# Patient Record
Sex: Female | Born: 1995 | Race: Black or African American | Hispanic: No | Marital: Single | Smoking: Never smoker
Health system: Southern US, Community
[De-identification: ages and names within clinical notes are randomized; demographics above are authoritative.]

## PROBLEM LIST (undated history)

## (undated) DIAGNOSIS — G43009 Migraine without aura, not intractable, without status migrainosus: Secondary | ICD-10-CM

## (undated) DIAGNOSIS — T7840XA Allergy, unspecified, initial encounter: Secondary | ICD-10-CM

## (undated) DIAGNOSIS — J45909 Unspecified asthma, uncomplicated: Secondary | ICD-10-CM

## (undated) DIAGNOSIS — G809 Cerebral palsy, unspecified: Secondary | ICD-10-CM

## (undated) DIAGNOSIS — I639 Cerebral infarction, unspecified: Secondary | ICD-10-CM

## (undated) DIAGNOSIS — K219 Gastro-esophageal reflux disease without esophagitis: Secondary | ICD-10-CM

## (undated) HISTORY — DX: Cerebral infarction, unspecified: I63.9

## (undated) HISTORY — PX: SHUNT REPLACEMENT: SHX5403

## (undated) HISTORY — DX: Unspecified asthma, uncomplicated: J45.909

## (undated) HISTORY — DX: Neonatal cerebral infarction, unspecified side: P91.829

## (undated) HISTORY — PX: TENDON RELEASE: SHX230

## (undated) HISTORY — DX: Migraine without aura, not intractable, without status migrainosus: G43.009

## (undated) HISTORY — DX: Gastro-esophageal reflux disease without esophagitis: K21.9

## (undated) HISTORY — DX: Allergy, unspecified, initial encounter: T78.40XA

---
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
## 2017-05-21 ENCOUNTER — Other Ambulatory Visit: Payer: Self-pay | Admitting: Allergy and Immunology

## 2017-07-24 ENCOUNTER — Other Ambulatory Visit: Payer: Self-pay | Admitting: Allergy and Immunology

## 2017-07-24 NOTE — Telephone Encounter (Signed)
Received fax for refill for Omeprazole 40 mg. Patient was last seen 09/05/2016. Patient was to return in June. Patient needs office visit.

## 2017-09-14 ENCOUNTER — Telehealth: Payer: Self-pay

## 2017-09-14 NOTE — Telephone Encounter (Signed)
Received fax requesting refills for Symbicort and Omeprazole. Pt is due for OV.

## 2017-09-25 DIAGNOSIS — G930 Cerebral cysts: Secondary | ICD-10-CM | POA: Insufficient documentation

## 2017-09-25 DIAGNOSIS — N920 Excessive and frequent menstruation with regular cycle: Secondary | ICD-10-CM | POA: Insufficient documentation

## 2017-09-25 DIAGNOSIS — R569 Unspecified convulsions: Secondary | ICD-10-CM | POA: Insufficient documentation

## 2017-11-01 DIAGNOSIS — D500 Iron deficiency anemia secondary to blood loss (chronic): Secondary | ICD-10-CM | POA: Insufficient documentation

## 2017-11-01 DIAGNOSIS — R7303 Prediabetes: Secondary | ICD-10-CM | POA: Insufficient documentation

## 2017-11-20 ENCOUNTER — Encounter: Payer: Self-pay | Admitting: Allergy and Immunology

## 2017-11-20 ENCOUNTER — Ambulatory Visit (INDEPENDENT_AMBULATORY_CARE_PROVIDER_SITE_OTHER): Payer: Medicaid Other | Admitting: Allergy and Immunology

## 2017-11-20 VITALS — BP 100/64 | HR 76 | Resp 20

## 2017-11-20 DIAGNOSIS — J4540 Moderate persistent asthma, uncomplicated: Secondary | ICD-10-CM

## 2017-11-20 DIAGNOSIS — J3089 Other allergic rhinitis: Secondary | ICD-10-CM | POA: Diagnosis not present

## 2017-11-20 DIAGNOSIS — K219 Gastro-esophageal reflux disease without esophagitis: Secondary | ICD-10-CM | POA: Diagnosis not present

## 2017-11-20 MED ORDER — BUDESONIDE-FORMOTEROL FUMARATE 160-4.5 MCG/ACT IN AERO: 2.0000 | INHALATION_SPRAY | Freq: Two times a day (BID) | RESPIRATORY_TRACT | 3 refills | Status: DC

## 2017-11-20 MED ORDER — RANITIDINE HCL 300 MG PO TABS: 300.0000 mg | ORAL_TABLET | Freq: Every day | ORAL | 5 refills | Status: DC

## 2017-11-20 MED ORDER — OMEPRAZOLE 40 MG PO CPDR: 40.0000 mg | DELAYED_RELEASE_CAPSULE | Freq: Two times a day (BID) | ORAL | 3 refills | Status: DC

## 2017-11-20 NOTE — Progress Notes (Signed)
Follow-up Note  Referring Provider: Joaquin Courts, MD Primary Provider: Joaquin Courts, MD Date of Office Visit: 11/20/2017  Subjective:   Mia Meyer (DOB: 15-Jul-1996) is a 22 y.o. female who returns to the [facility] on 11/20/2017 in re-evaluation of the following:  HPI: Symphonie presents to this clinic in reevaluation of asthma and allergic rhinitis and LPR.  I last saw her in his clinic 05 September 2016.  With using a collection of anti-inflammatory medications for her airway and therapy directed against reflux she did very good over the course of the past year without any significant flareups of her airway issue requiring a systemic steroid or an antibiotic.  Rarely did she use a short acting bronchodilator.  Unfortunately, about a month or 2 ago she stopped using her medications and she has developed sneezing and nasal congestion and coughing and lots of reflux with belching and lots of throat clearing.  She has not had any associated fever or ugly nasal discharge or ugly sputum production or chest pain.  She did obtain the flu vaccine this past year.  Allergies as of 11/20/2017      Reactions   Molds & Smuts Swelling   Cough.    Other    Seasonal Allergies       Medication List      albuterol (2.5 MG/3ML) 0.083% nebulizer solution Commonly known as:  PROVENTIL Inhale 3 mLs into the lungs every 4 (four) hours as needed for wheezing (cough.).   PROAIR HFA 108 (90 Base) MCG/ACT inhaler Generic drug:  albuterol Inhale two puffs every 4-6 hours if needed for cough or wheeze   budesonide-formoterol 160-4.5 MCG/ACT inhaler Commonly known as:  SYMBICORT Inhale 2 puffs into the lungs 2 (two) times daily.   cetirizine 10 MG tablet Commonly known as:  ZYRTEC Take by mouth.   cyclobenzaprine 5 MG tablet Commonly known as:  FLEXERIL Take 5 mg by mouth 2 (two) times daily. As directed.   DIASTAT ACUDIAL 10 MG Gel Generic drug:  diazepam     fluticasone 50 MCG/ACT nasal spray Commonly known as:  FLONASE Place into the nose.   GuanFACINE HCl 3 MG Tb24 Take 3 mg by mouth daily.   ibuprofen 600 MG tablet Commonly known as:  ADVIL,MOTRIN Take 600 mg by mouth every 6 (six) hours as needed for pain.   montelukast 10 MG tablet Commonly known as:  SINGULAIR Take 1 tablet (10 mg total) by mouth at bedtime.   norethindrone-ethinyl estradiol 1-20 MG-MCG tablet Commonly known as:  JUNEL FE,GILDESS FE,LOESTRIN FE Take by mouth.   omeprazole 40 MG capsule Commonly known as:  PRILOSEC Take 1 capsule (40 mg total) by mouth 2 (two) times daily.   polyethylene glycol powder powder Commonly known as:  GLYCOLAX/MIRALAX 1/2 TO 1 CAPFUL IN 8OZ BEVERAGE DAILY AS NEEDED   QVAR 40 MCG/ACT inhaler Generic drug:  beclomethasone inhale 2 puffs by mouth twice a day   SETLAKIN 0.15-0.03 MG tablet Generic drug:  levonorgestrel-ethinyl estradiol Take 1 tablet by mouth daily.   triamcinolone cream 0.1 % Commonly known as:  KENALOG Apply 1 application topically 2 (two) times daily.       Past Medical History:  Diagnosis Date  . Allergy    mold allergy  . Asthma   . Cerebral palsy ([facility])   . GERD (gastroesophageal reflux disease)   . Neonatal stroke [facility])     Past Surgical History:  Procedure Laterality Date  .  SHUNT REPLACEMENT Left    Shunt replaced in 2002  . TENDON RELEASE Right     Review of systems negative except as noted in HPI / PMHx or noted below:  Review of Systems  Constitutional: Negative.   HENT: Negative.   Eyes: Negative.   Respiratory: Negative.   Cardiovascular: Negative.   Gastrointestinal: Negative.   Genitourinary: Negative.   Musculoskeletal: Negative.   Skin: Negative.   Neurological: Negative.   Endo/Heme/Allergies: Negative.   Psychiatric/Behavioral: Negative.      Objective:   Vitals:   11/20/17 1040  BP: 100/64  Pulse: 76  Resp: 20          Physical Exam  Constitutional:  She is well-developed, well-nourished, and in no distress.  HENT:  Head: Normocephalic.  Right Ear: Tympanic membrane, external ear and ear canal normal.  Left Ear: Tympanic membrane, external ear and ear canal normal.  Nose: Nose normal. No mucosal edema or rhinorrhea.  Mouth/Throat: Uvula is midline, oropharynx is clear and moist and mucous membranes are normal. No oropharyngeal exudate.  Eyes: Conjunctivae are normal.  Neck: Trachea normal. No tracheal tenderness present. No tracheal deviation present. No thyromegaly present.  Cardiovascular: Normal rate, regular rhythm, S1 normal, S2 normal and normal heart sounds.  No murmur heard. Pulmonary/Chest: Breath sounds normal. No stridor. No respiratory distress. She has no wheezes. She has no rales.  Musculoskeletal: She exhibits no edema.  Lymphadenopathy:       Head (right side): No tonsillar adenopathy present.       Head (left side): No tonsillar adenopathy present.    She has no cervical adenopathy.  Neurological: She is alert. Gait normal.  Skin: No rash noted. She is not diaphoretic. No erythema. Nails show no clubbing.  Psychiatric: Mood and affect normal.    Diagnostics:    Spirometry was performed and demonstrated an FEV1 of 2.06 at 74 % of predicted.  The patient had an Asthma Control Test with the following results: ACT Total Score: 16.    Assessment and Plan:   1. Not well controlled moderate persistent asthma   2. Other allergic rhinitis   3. LPRD (laryngopharyngeal reflux disease)     1. Continue to Treat inflammation:   A. Symbicort 160 - 2 inhalations twice a day with spacer  B. nasal fluticasone one spray each nostril one time per day  C. montelukast 10 mg tablet 1 time per day  2. Continue to Treat reflux:   A. omeprazole 40 mg twice a day  B. ranitidine 300 mg once a day in evening  C. no caffeine or chocolate consumption  3. If needed:   A. ProAir HFA or albuterol nebulization  B. cetirizine 10 mg  tablet 1 time per day  4. Return to clinic in 6 months or earlier if there is a problem  Zamya should do much better regarding her respiratory tract symptoms when she restarts therapy directed against respiratory tract inflammation and reflux as noted above.  She and her mom will keep in contact with me noting her response as she moves forward.  I will see her back in this clinic in 6 months or earlier if there is a problem.  Allena Katz, MD Allergy / Immunology [place] [facility]

## 2017-11-20 NOTE — Patient Instructions (Signed)
  1. Continue to Treat inflammation:   A. Symbicort 160 - 2 inhalations twice a day with spacer  B. nasal fluticasone one spray each nostril one time per day  C. montelukast 10 mg tablet 1 time per day  2. Continue to Treat reflux:   A. omeprazole 40 mg twice a day  B. ranitidine 300 mg once a day in evening  C. no caffeine or chocolate consumption  3. If needed:   A. ProAir HFA or albuterol nebulization  B. cetirizine 10 mg tablet 1 time per day  4. Return to clinic in 6 months or earlier if there is a problem

## 2017-11-21 ENCOUNTER — Encounter: Payer: Self-pay | Admitting: Allergy and Immunology

## 2017-11-27 ENCOUNTER — Encounter: Payer: Self-pay | Admitting: Neurology

## 2017-11-27 ENCOUNTER — Ambulatory Visit (INDEPENDENT_AMBULATORY_CARE_PROVIDER_SITE_OTHER): Payer: Medicaid Other | Admitting: Neurology

## 2017-11-27 ENCOUNTER — Other Ambulatory Visit: Payer: Self-pay

## 2017-11-27 ENCOUNTER — Ambulatory Visit: Payer: Medicaid Other | Admitting: Neurology

## 2017-11-27 VITALS — BP 123/81 | HR 78 | Ht 63.0 in | Wt 223.5 lb

## 2017-11-27 DIAGNOSIS — G479 Sleep disorder, unspecified: Secondary | ICD-10-CM

## 2017-11-27 DIAGNOSIS — Z982 Presence of cerebrospinal fluid drainage device: Secondary | ICD-10-CM

## 2017-11-27 DIAGNOSIS — G43009 Migraine without aura, not intractable, without status migrainosus: Secondary | ICD-10-CM | POA: Diagnosis not present

## 2017-11-27 DIAGNOSIS — G808 Other cerebral palsy: Secondary | ICD-10-CM | POA: Diagnosis not present

## 2017-11-27 DIAGNOSIS — Z87898 Personal history of other specified conditions: Secondary | ICD-10-CM | POA: Diagnosis not present

## 2017-11-27 DIAGNOSIS — R269 Unspecified abnormalities of gait and mobility: Secondary | ICD-10-CM | POA: Diagnosis not present

## 2017-11-27 HISTORY — DX: Migraine without aura, not intractable, without status migrainosus: G43.009

## 2017-11-27 MED ORDER — SUMATRIPTAN SUCCINATE 100 MG PO TABS: 100.0000 mg | ORAL_TABLET | Freq: Two times a day (BID) | ORAL | 2 refills | Status: DC | PRN

## 2017-11-27 NOTE — Patient Instructions (Signed)
   We will use Imitrex if needed for the migraine headache.   Use Magnesium suplimentation for the muscle spasms.  We will get a sleep evauation.

## 2017-11-27 NOTE — Progress Notes (Signed)
Reason for visit: History of seizures, migraine headache  Referring physician: Dr. Teryl Meyer is a 22 y.o. female  History of present illness:  Mia Meyer is a 23 year old right-handed black female with a history of cerebral palsy with a right hemiparesis.  The patient has required a VP shunt placement, she is followed through [facility].  The patient has a history of seizures since she was around 53 month old, she has not had any seizures since 2005, she is currently off of seizure medications.  She does have Diastat to take if needed.  The mother indicates that when the patient has a seizure she will turn her head to the left and she will go limp and may fall over.  She does not convulse.  The patient last had MRI of the brain on 08 May 2017 and her shunt was stable at that time.  The patient has developed some problems with migraine headaches that may occur on average once or twice a month.  The migraine headaches last for several hours, but the patient will sleep up to the day of the headache.  The mother goes on to say that the patient sleeps a lot anyway, she oftentimes will sleep through classes in school.  The patient does snore some at night.  The patient will take Tylenol or ibuprofen for the headache without much benefit, she occasionally have some nausea and vomiting.  Chocolate may activate the headache.  She does have some chronic gait instability associated with her right hemiparesis.  She denies any numbness of the extremities, she will fall on occasion.  She has chronic constipation issues and she does have some urinary urgency and occasional incontinence.  The patient is sent through this office for further evaluation.  Past Medical History:  Diagnosis Date  . Allergy    mold allergy  . Asthma   . Cerebral palsy ([place])   . Common migraine 11/27/2017  . GERD (gastroesophageal reflux disease)   . Neonatal stroke [facility])     Past Surgical History:    Procedure Laterality Date  . SHUNT REPLACEMENT Left    Shunt replaced in 2002  . TENDON RELEASE Right     Family History  Problem Relation Age of Onset  . Migraines Mother   . Anxiety disorder Mother   . Bipolar disorder Neg Hx   . Schizophrenia Neg Hx   . Depression Neg Hx   . ADD / ADHD Neg Hx   . Autism Neg Hx   . Seizures Neg Hx     Social history:  reports that she has never smoked. She has never used smokeless tobacco. She reports that she does not drink alcohol or use drugs.  Medications:  Prior to Admission medications   Medication Sig Start Date End Date Taking? Authorizing Provider  albuterol (PROVENTIL) (2.5 MG/3ML) 0.083% nebulizer solution Inhale 3 mLs into the lungs every 4 (four) hours as needed for wheezing (cough.).  05/24/16  Yes [provider]  budesonide-formoterol (SYMBICORT) 160-4.5 MCG/ACT inhaler Inhale 2 puffs into the lungs 2 (two) times daily. 11/20/17  Yes Kozlow, Donnamarie Poag, MD  cetirizine (ZYRTEC) 10 MG tablet Take by mouth.   Yes [provider]  cyclobenzaprine (FLEXERIL) 5 MG tablet Take 5 mg by mouth 2 (two) times daily. As directed. 05/13/16  Yes [provider]  DIASTAT ACUDIAL 10 MG GEL  09/07/16  Yes [provider]  fluticasone (FLONASE) 50 MCG/ACT nasal spray Place into the  nose. 08/08/13  Yes [provider]  GuanFACINE HCl 3 MG TB24 Take 3 mg by mouth daily. 05/24/16  Yes [provider]  ibuprofen (ADVIL,MOTRIN) 600 MG tablet Take 600 mg by mouth every 6 (six) hours as needed for pain. 05/13/16  Yes [provider]  montelukast (SINGULAIR) 10 MG tablet Take 1 tablet (10 mg total) by mouth at bedtime. 07/04/16  Yes Kozlow, Donnamarie Poag, MD  norethindrone-ethinyl estradiol (JUNEL FE,GILDESS FE,LOESTRIN FE) 1-20 MG-MCG tablet Take by mouth.   Yes [provider]  omeprazole (PRILOSEC) 40 MG capsule Take 1 capsule (40 mg total) by mouth 2 (two) times daily. 11/20/17  Yes Kozlow, Donnamarie Poag,  MD  polyethylene glycol powder (GLYCOLAX/MIRALAX) powder 1/2 TO 1 CAPFUL IN 8OZ BEVERAGE DAILY AS NEEDED 09/06/16  Yes [provider]  PROAIR HFA 108 (90 Base) MCG/ACT inhaler Inhale two puffs every 4-6 hours if needed for cough or wheeze 09/06/16  Yes Kozlow, Donnamarie Poag, MD  QVAR 40 MCG/ACT inhaler inhale 2 puffs by mouth twice a day 08/11/16  Yes Kozlow, Donnamarie Poag, MD  ranitidine (ZANTAC) 300 MG tablet Take 1 tablet (300 mg total) by mouth at bedtime. 11/20/17  Yes Kozlow, Donnamarie Poag, MD  SETLAKIN 0.15-0.03 MG tablet Take 1 tablet by mouth daily. 05/23/16  Yes [provider]  triamcinolone cream (KENALOG) 0.1 % Apply 1 application topically 2 (two) times daily. 05/24/16  Yes [provider]  SUMAtriptan (IMITREX) 100 MG tablet Take 1 tablet (100 mg total) by mouth 2 (two) times daily as needed for up to 1 dose for migraine. 11/27/17   Kathrynn Ducking, MD      Allergies  Allergen Reactions  . Molds & Smuts Swelling    Cough.   . Other     Seasonal Allergies      ROS:  Out of a complete 14 system review of symptoms, the patient complains only of the following symptoms, and all other reviewed systems are negative.  Headache Drowsiness  Blood pressure 123/81, pulse 78, height 5\' 3"  (1.6 m), weight 223 lb 8 oz (101.4 kg).  Physical Exam  General: The patient is alert and cooperative at the time of the examination.  The patient is markedly obese.  Eyes: Pupils are equal, round, and reactive to light. Discs are flat bilaterally.  Neck: The neck is supple, no carotid bruits are noted.  Respiratory: The respiratory examination is clear.  Cardiovascular: The cardiovascular examination reveals a regular rate and rhythm, no obvious murmurs or rubs are noted.  Skin: Extremities are without significant edema.  Relative atrophy of the right arm and right leg is noted as compared to the left side.  Neurologic Exam  Mental status: The patient is alert and oriented x 3 at  the time of the examination. The patient has apparent normal recent and remote memory, with an apparently normal attention span and concentration ability.  Cranial nerves: Facial symmetry is present. There is good sensation of the face to pinprick and soft touch bilaterally. The strength of the facial muscles and the muscles to head turning and shoulder shrug are normal bilaterally. Speech is well enunciated, no aphasia or dysarthria is noted. Extraocular movements are full. Visual fields are full. The tongue is midline, and the patient has symmetric elevation of the soft palate. No obvious hearing deficits are noted.  Motor: The motor testing reveals 5 over 5 strength of the left extremities.  On the right side, the patient has 4/5 strength with  grip and 4+/5 strength with biceps and triceps and deltoid strength, and with proximal strength of the right leg.  The patient has a right foot drop good symmetric motor tone is noted throughout.  Sensory: Sensory testing is intact to pinprick, soft touch, vibration sensation, and position sense on all 4 extremities. No evidence of extinction is noted.  Coordination: Cerebellar testing reveals good finger-nose-finger and heel-to-shin bilaterally with exception the patient has some difficulty performing finger-nose-finger on the right.  Gait and station: Gait is associated with a circumduction type gait with the right leg.  Tandem gait was not attempted.  Romberg is negative. No drift is seen.  Reflexes: Deep tendon reflexes are symmetric, but are slightly depressed bilaterally. Toes are downgoing bilaterally.   Assessment/Plan:  1.  Cerebral palsy, right hemiparesis  2.  History of seizures  3.  Migraine headache  4.  Excessive daytime drowsiness  The patient will be placed on Imitrex to take if needed for the migraine.  Given the excessive daytime drowsiness, the patient will be referred to have a sleep evaluation.  The patient has not had any  seizures in almost 15 years.  There is no indication for antiepileptic medications at this time.  The patient will follow-up in 6 months.  Jill Alexanders MD 11/27/2017 9:58 AM  [facility] [address] [place], [place] [address]  Phone [phone] Fax [phone]

## 2018-01-17 ENCOUNTER — Institutional Professional Consult (permissible substitution): Payer: Medicaid Other | Admitting: Neurology

## 2018-02-21 ENCOUNTER — Institutional Professional Consult (permissible substitution): Payer: Medicaid Other | Admitting: Neurology

## 2018-04-08 ENCOUNTER — Telehealth: Payer: Self-pay | Admitting: Neurology

## 2018-04-08 MED ORDER — SUMATRIPTAN SUCCINATE 100 MG PO TABS: ORAL_TABLET | ORAL | 2 refills | Status: DC

## 2018-04-08 NOTE — Telephone Encounter (Addendum)
Called and spoke with patient. Mother was on the phone with pt. I did advise new DPR form will need to be completed for [place] to speak with mother in the future. They both verbalized understanding. I went over last OV note and verified they were referring to rx imitrex to take prn for migraines. I did remind her she gets #10/month and to only take prn. Verified pharmacy on file and refilled for pt. Also reminded her about Dr. Rexene Alberts appt on 04/10/18. She verbalized understanding.

## 2018-04-08 NOTE — Telephone Encounter (Signed)
Patient's mothers called and stated that Dr. Jannifer Franklin had wanted her daughter to start a new medication for Migraines but the pharmacy has not received it. She did not know the name of the medication. The other was asking questions but I told her I was unable to discuss with her because she is not listed on the patients DPR. I told her I would send her message back to Dr. Jannifer Franklin' nurse and advised for the patient to call back with additional questions.

## 2018-04-08 NOTE — Telephone Encounter (Addendum)
At last OV, Dr. Jannifer Franklin provided patient with imitrex to take prn for migraines. I called her pharmacy and she received last refill on 03/14/18 for this.  Appears pt has Sleep consult with Dr. Rexene Alberts on 04/10/18.

## 2018-04-10 ENCOUNTER — Telehealth: Payer: Self-pay | Admitting: *Deleted

## 2018-04-10 ENCOUNTER — Encounter: Payer: Self-pay | Admitting: Neurology

## 2018-04-10 ENCOUNTER — Ambulatory Visit: Payer: Medicaid Other | Admitting: Neurology

## 2018-04-10 VITALS — BP 102/80 | HR 80 | Ht 63.0 in | Wt 225.0 lb

## 2018-04-10 DIAGNOSIS — R0683 Snoring: Secondary | ICD-10-CM | POA: Diagnosis not present

## 2018-04-10 DIAGNOSIS — G4719 Other hypersomnia: Secondary | ICD-10-CM | POA: Diagnosis not present

## 2018-04-10 DIAGNOSIS — R351 Nocturia: Secondary | ICD-10-CM

## 2018-04-10 DIAGNOSIS — E669 Obesity, unspecified: Secondary | ICD-10-CM | POA: Diagnosis not present

## 2018-04-10 DIAGNOSIS — Z72821 Inadequate sleep hygiene: Secondary | ICD-10-CM | POA: Diagnosis not present

## 2018-04-10 NOTE — Patient Instructions (Signed)
Thank you for choosing [facility] for your sleep related care! It was nice to meet you today! I appreciate that you entrust me with your sleep related healthcare concerns. I hope, I was able to address at least some of your concerns today, and that I can help you feel reassured and also get better.    Here is what we discussed today and what we came up with as our plan for you:   Please remember to try to maintain good sleep hygiene, which means: Keep a regular sleep and wake schedule, try not to exercise or have a meal within 2 hours of your bedtime, try to keep your bedroom conducive for sleep, that is, cool and dark, without light distractors such as an illuminated alarm clock, and refrain from watching TV right before sleep or in the middle of the night and do not keep the TV or radio on during the night. Also, try not to use or play on electronic devices at bedtime, such as your cell phone, tablet PC or laptop. If you like to read at bedtime on an electronic device, try to dim the background light as much as possible. Do not eat in the middle of the night.   Based on your symptoms and your exam I believe you are at risk for obstructive sleep apnea (aka OSA), and I think we should proceed with a sleep study to determine whether you do or do not have OSA and how severe it is. Even, if you have mild OSA, I may want you to consider treatment with CPAP, as treatment of even borderline or mild sleep apnea can result and improvement of symptoms such as sleep disruption, daytime sleepiness, nighttime bathroom breaks, restless leg symptoms, improvement of headache syndromes, even improved mood disorder.   Please remember, the long-term risks and ramifications of untreated moderate to severe obstructive sleep apnea are: increased Cardiovascular disease, including congestive heart failure, stroke, difficult to control hypertension, treatment resistant obesity, arrhythmias, especially irregular  heartbeat commonly known as A. Fib. (atrial fibrillation); even type 2 diabetes has been linked to untreated OSA.   Sleep apnea can cause disruption of sleep and sleep deprivation in most cases, which, in turn, can cause recurrent headaches, problems with memory, mood, concentration, focus, and vigilance. Most people with untreated sleep apnea report excessive daytime sleepiness, which can affect their ability to drive. Please do not drive if you feel sleepy. Patients with sleep apnea developed difficulty initiating and maintaining sleep (aka insomnia).   Having sleep apnea may increase your risk for other sleep disorders, including involuntary behaviors sleep such as sleep terrors, sleep talking, sleepwalking.    Having sleep apnea can also increase your risk for restless leg syndrome and leg movements at night.   Please note that untreated obstructive sleep apnea may carry additional perioperative morbidity. Patients with significant obstructive sleep apnea (typically, in the moderate to severe degree) should receive, if possible, perioperative PAP (positive airway pressure) therapy and the surgeons and particularly the anesthesiologists should be informed of the diagnosis and the severity of the sleep disordered breathing.   I will likely see you back after your sleep study to go over the test results and where to go from there. We will call you after your sleep study to advise about the results (most likely, you will hear from [place], my nurse) and to set up an appointment at the time, as necessary.    Our sleep lab administrative assistant will call you to  schedule your sleep study and give you further instructions, regarding the check in process for the sleep study, arrival time, what to bring, when you can expect to leave after the study, etc., and to answer any other logistical questions you may have. If you don't hear back from her by about 2 weeks from now, please feel free to call her direct  line at [phone] or you can call our general clinic number, or email [place] through My Chart.

## 2018-04-10 NOTE — Progress Notes (Signed)
Subjective:    Patient ID: Mia Meyer is a 22 y.o. female.  HPI     Star Age, MD, PhD [facility] [address] [place], [place] [address]  Dear Lanny Hurst,   I saw your patient, Mia Meyer, upon your kind request in my clinic today for initial consultation of her sleep disorder, in particular her excessive daytime somnolence. The patient is accompanied by her mother and younger sister today. As you know, Mia Meyer is a 22 year old with an underlying medical history of seizures, migraine headaches, cerebral palsy with right-sided hemiparesis, VP shunt placement and obesity, who per mom has snoring and daytime somnolence. I reviewed an office note from 11/27/2017. She does not keep a very scheduled nighttime or rise time routine. She is often in bed around 8 and tends to play on her cell phone, she may not be asleep until after midnight. Her rise time is around 8:30 AM. She has a cousin with sleep apnea. She had sleep study testing in November 2016 at [facility] which was negative for sleep apnea at the time. She has gained about 25 pounds since then. She has nocturia about 2-3 times per average night, sometimes as many as 5 times per night. Her Epworth sleepiness score is 19 out of 24, fatigue score is 36 out of 63.  Her Past Medical History Is Significant For: Past Medical History:  Diagnosis Date  . Allergy    mold allergy  . Asthma   . Cerebral palsy ([place])   . Common migraine 11/27/2017  . GERD (gastroesophageal reflux disease)   . Neonatal stroke [facility])     Her Past Surgical History Is Significant For: Past Surgical History:  Procedure Laterality Date  . SHUNT REPLACEMENT Left    Shunt replaced in 2002  . TENDON RELEASE Right     Her Family History Is Significant For: Family History  Problem Relation Age of Onset  . Migraines Mother   . Anxiety disorder Mother   . Bipolar disorder Neg Hx   . Schizophrenia Neg Hx    . Depression Neg Hx   . ADD / ADHD Neg Hx   . Autism Neg Hx   . Seizures Neg Hx     Her Social History Is Significant For: Social History   Socioeconomic History  . Marital status: Single    Spouse name: Not on file  . Number of children: 0  . Years of education: 69  . Highest education level: Not on file  Occupational History  . Not on file  Social Needs  . Financial resource strain: Not on file  . Food insecurity:    Worry: Not on file    Inability: Not on file  . Transportation needs:    Medical: Not on file    Non-medical: Not on file  Tobacco Use  . Smoking status: Never Smoker  . Smokeless tobacco: Never Used  Substance and Sexual Activity  . Alcohol use: No  . Drug use: No  . Sexual activity: Never    Birth control/protection: Pill  Lifestyle  . Physical activity:    Days per week: Not on file    Minutes per session: Not on file  . Stress: Not on file  Relationships  . Social connections:    Talks on phone: Not on file    Gets together: Not on file    Attends religious service: Not on file    Active member of club or  organization: Not on file    Attends meetings of clubs or organizations: Not on file    Relationship status: Not on file  Other Topics Concern  . Not on file  Social History Narrative   Lives w/ mother and sister   Caffeine use: 1-2 times per day    Left handed   Mia Meyer is in the 12 th grade at [employer]; doing well.     Her Allergies Are:  Allergies  Allergen Reactions  . Molds & Smuts Swelling    Cough.   . Other     Seasonal Allergies    :   Her Current Medications Are:  Outpatient Encounter Medications as of 04/10/2018  Medication Sig  . albuterol (PROVENTIL) (2.5 MG/3ML) 0.083% nebulizer solution Inhale 3 mLs into the lungs every 4 (four) hours as needed for wheezing (cough.).   Marland Kitchen budesonide-formoterol (SYMBICORT) 160-4.5 MCG/ACT inhaler Inhale 2 puffs into the lungs 2 (two) times daily.  . cetirizine (ZYRTEC) 10 MG  tablet Take by mouth.  . cyclobenzaprine (FLEXERIL) 5 MG tablet Take 5 mg by mouth 2 (two) times daily. As directed.  Marland Kitchen DIASTAT ACUDIAL 10 MG GEL   . fluticasone (FLONASE) 50 MCG/ACT nasal spray Place into the nose.  . GuanFACINE HCl 3 MG TB24 Take 3 mg by mouth daily.  Marland Kitchen ibuprofen (ADVIL,MOTRIN) 600 MG tablet Take 600 mg by mouth every 6 (six) hours as needed for pain.  . montelukast (SINGULAIR) 10 MG tablet Take 1 tablet (10 mg total) by mouth at bedtime.  . norethindrone-ethinyl estradiol (JUNEL FE,GILDESS FE,LOESTRIN FE) 1-20 MG-MCG tablet Take by mouth.  Marland Kitchen omeprazole (PRILOSEC) 40 MG capsule Take 1 capsule (40 mg total) by mouth 2 (two) times daily.  . polyethylene glycol powder (GLYCOLAX/MIRALAX) powder 1/2 TO 1 CAPFUL IN 8OZ BEVERAGE DAILY AS NEEDED  . PROAIR HFA 108 (90 Base) MCG/ACT inhaler Inhale two puffs every 4-6 hours if needed for cough or wheeze  . QVAR 40 MCG/ACT inhaler inhale 2 puffs by mouth twice a day  . ranitidine (ZANTAC) 300 MG tablet Take 1 tablet (300 mg total) by mouth at bedtime.  Jarvis Newcomer 0.15-0.03 MG tablet Take 1 tablet by mouth daily.  . SUMAtriptan (IMITREX) 100 MG tablet Take 1 tablet at onset of migraine. Can repeat once in 2 hr if needed. No more than 2 tabs in 24 hr.  . triamcinolone cream (KENALOG) 0.1 % Apply 1 application topically 2 (two) times daily.   No facility-administered encounter medications on file as of 04/10/2018.   :  Review of Systems:  Out of a complete 14 point review of systems, all are reviewed and negative with the exception of these symptoms as listed below:  Review of Systems  Neurological:       Pt presents today to discuss her sleep. Pt had a sleep study about 9 years ago and believes that it was normal. Pt does endorse snoring.  Epworth Sleepiness Scale 0= would never doze 1= slight chance of dozing 2= moderate chance of dozing 3= high chance of dozing  Sitting and reading: 2 Watching TV: 3 Sitting inactive in a  public place (ex. Theater or meeting): 2 As a passenger in a car for an hour without a break: 3 Lying down to rest in the afternoon: 3 Sitting and talking to someone: 2 Sitting quietly after lunch (no alcohol): 2 In a car, while stopped in traffic: 2 Total: 19     Objective:  Neurological Exam  Physical  Exam Physical Examination:   Vitals:   04/10/18 1603  BP: 102/80  Pulse: 80   General Examination: The patient is a very pleasant 22 y.o. female in no acute distress. She appears well-developed and well-nourished and well groomed.   HEENT: Normocephalic, atraumatic, pupils are equal, round and reactive to light and accommodation. Extraocular tracking is good without limitation to gaze excursion or nystagmus noted. Normal smooth pursuit is noted. Hearing is grossly intact. Face is symmetric with normal facial animation and normal facial sensation. Speech is clear with no dysarthria noted. There is no hypophonia. There is no lip, neck/head, jaw or voice tremor. Neck is supple with full range of passive and active motion. There are no carotid bruits on auscultation. Oropharynx exam reveals: mild mouth dryness, adequate dental hygiene and moderate airway crowding, due to Tonsillar size of 2-3+, larger tongue, large uvula. Mallampati is class II. Neck circumference is 16-1/4 inches. She has a mild overbite. Tongue protrudes centrally and palate elevates symmetrically.  Chest: Clear to auscultation without wheezing, rhonchi or crackles noted.  Heart: S1+S2+0, regular and normal without murmurs, rubs or gallops noted.   Abdomen: Soft, non-tender and non-distended with normal bowel sounds appreciated on auscultation.  Extremities: There is no pitting edema in the distal lower extremities bilaterally.   Skin: Warm and dry without trophic changes noted.  Musculoskeletal: exam reveals no obvious joint deformities, tenderness or joint swelling or erythema.   Neurologically:  Mental status:  The patient is awake, alert and oriented in all 4 spheres. Her immediate and remote memory, attention, language skills and fund of knowledge are appropriate. There is no evidence of aphasia, agnosia, apraxia or anomia. Speech is clear with normal prosody and enunciation. Thought process is linear. Mood is normal and affect is normal.  Cranial nerves II - XII are as described above under HEENT exam. In addition: shoulder shrug is normal with equal shoulder height noted. Motor exam: Normal bulk, strength and tone is noted on the L, she has mild weakness and spasticity in the RUE > RLE. There is no tremor. Fine motor skills and coordination: intact on the L.  Cerebellar testing: No obvious dysmetria or intention tremor.  Sensory exam: intact to light touch.  Gait, station and balance: She stands easily. No veering to one side is noted. No leaning to one side is noted. Posture is age-appropriate and stance is narrow based. Gait shows mild limp on the R.          Assessment and Plan:   In summary, Mia Meyer is a very pleasant 22 y.o.-year old female with an underlying medical history of seizures, migraine headaches, cerebral palsy with right-sided hemiparesis, VP shunt placement and obesity, whose history and physical exam are concerning for obstructive sleep apnea (OSA). I had a long chat with the patient and her mother about my findings and the diagnosis of OSA, its prognosis and treatment options. We talked about medical treatments, surgical interventions and non-pharmacological approaches. I explained in particular the risks and ramifications of untreated moderate to severe OSA, especially with respect to developing cardiovascular disease down the Road, including congestive heart failure, difficult to treat hypertension, cardiac arrhythmias, or stroke. Even type 2 diabetes has, in part, been linked to untreated OSA. Symptoms of untreated OSA include daytime sleepiness, memory problems, mood  irritability and mood disorder such as depression and anxiety, lack of energy, as well as recurrent headaches, especially morning headaches. We talked about trying to maintain a healthy lifestyle in general, as  well as the importance of weight control. I encouraged the patient to eat healthy, exercise daily and keep well hydrated, to keep a scheduled bedtime and wake time routine, to not skip any meals and eat healthy snacks in between meals. I advised the patient not to drive when feeling sleepy. She does not drive. We also talked about the importance of good sleep hygiene. I recommended the following at this time: sleep study with potential positive airway pressure titration. (We will score hypopneas at 4%).   I explained the sleep test procedure to the patient and also outlined possible surgical and non-surgical treatment options of OSA, including the use of a custom-made dental device (which would require a referral to a specialist dentist or oral surgeon), upper airway surgical options, such as pillar implants, radiofrequency surgery, tongue base surgery, and UPPP (which would involve a referral to an ENT surgeon). Rarely, jaw surgery such as mandibular advancement may be considered.  I also explained the CPAP treatment option to the patient, who indicated that she would be willing to try CPAP if the need arises. I explained the importance of being compliant with PAP treatment, not only for insurance purposes but primarily to improve Her symptoms, and for the patient's long term health benefit, including to reduce Her cardiovascular risks. I answered all their questions today and the patient and her mom were in agreement. I plan to see her back after the sleep study is completed and encouraged them to call with any interim questions, concerns, problems or updates.   Thank you very much for allowing me to participate in the care of this nice patient. If I can be of any further assistance to you please do  not hesitate to talk to me.  Sincerely,   Star Age, MD, PhD

## 2018-04-10 NOTE — Telephone Encounter (Signed)
Patients mom wants Diastat Suppositories called to [facility].  States the MD that referred patient to Dr. Jannifer Franklin wants this to be filled by him since he is local.  Please call if questions.

## 2018-04-11 NOTE — Addendum Note (Signed)
Addended by: Kathrynn Ducking on: 04/11/2018 09:58 AM   Modules accepted: Orders

## 2018-04-11 NOTE — Telephone Encounter (Signed)
I called mother, left a message.  This patient has not had a seizure in over 15 years, she is not on any seizure medications, I do not think that there is an indication for the Diastat which is a very expensive medication.  If the patient has had a recent seizure, she is to contact our office.

## 2018-04-11 NOTE — Telephone Encounter (Signed)
I called the mother, the patient has not had a seizure in 15 years, there is no indication for Diastat, the patient is not even on an anticonvulsant medication at all.  If she does have recurrent seizure, they are to contact our office and we will get her worked in and we will start a seizure medication.  There is no indication for a prescription for Diastat at this time.

## 2018-04-11 NOTE — Telephone Encounter (Signed)
Pts mother Philis Nettle called stating that this medication was a backup incase the pt did have a seizure while away at school. Philis Nettle stating she is not comfortable not having a backup plan for the pt. Please call to advise.

## 2018-04-24 ENCOUNTER — Ambulatory Visit (HOSPITAL_COMMUNITY)
Admission: EM | Admit: 2018-04-24 | Discharge: 2018-04-24 | Disposition: A | Discharge status: Home / Self Care | Payer: Medicaid Other | Attending: Family Medicine | Admitting: Family Medicine

## 2018-04-24 ENCOUNTER — Encounter (HOSPITAL_COMMUNITY): Payer: Self-pay

## 2018-04-24 DIAGNOSIS — J209 Acute bronchitis, unspecified: Secondary | ICD-10-CM

## 2018-04-24 MED ORDER — FLUTICASONE PROPIONATE 50 MCG/ACT NA SUSP: 1.0000 | Freq: Every day | NASAL | 0 refills | Status: DC

## 2018-04-24 MED ORDER — AZITHROMYCIN 250 MG PO TABS: 250.0000 mg | ORAL_TABLET | Freq: Every day | ORAL | 0 refills | Status: DC

## 2018-04-24 MED ORDER — PSEUDOEPH-BROMPHEN-DM 30-2-10 MG/5ML PO SYRP: 5.0000 mL | ORAL_SOLUTION | Freq: Four times a day (QID) | ORAL | 0 refills | Status: DC | PRN

## 2018-04-24 MED ORDER — PROAIR HFA 108 (90 BASE) MCG/ACT IN AERS: INHALATION_SPRAY | RESPIRATORY_TRACT | 1 refills | Status: DC

## 2018-04-24 NOTE — Discharge Instructions (Signed)
Please begin azithromycin, 2 tablets today, 1 tablet for the following 4 days Continue to use Zyrtec, Flonase Please use cough syrup as needed every 8 hours Albuterol as needed  Cough may linger around, but as long as it is slowly improving in frequency and intensity  Please follow-up if developing fevers again, difficulty breathing, shortness of breath, wheezing, symptoms persisting without improvement

## 2018-04-24 NOTE — ED Triage Notes (Signed)
Pt presents with ongoing persistent cough.

## 2018-04-24 NOTE — ED Provider Notes (Signed)
[place]    CSN: [id number] Arrival date & time: 04/24/18  1521     History   Chief Complaint Chief Complaint  Patient presents with  . Cough    HPI Mia Meyer is a 22 y.o. female history of allergies, asthma, cerebral palsy, Patient is presenting with URI symptoms- congestion, cough, sore throat. Patient's main complaints are cough and persistence of symptoms. Symptoms have been going on for 2 weeks. Patient has tried Singulair, Zyrtec, albuterol, Flonase, Tylenol, herbal tea, with minimal relief. Denies fever, nausea, vomiting, diarrhea. Denies shortness of breath and chest pain.  Patient's mother and sister also here with similar symptoms that have been persistent.   HPI  Past Medical History:  Diagnosis Date  . Allergy    mold allergy  . Asthma   . Cerebral palsy ([place])   . Common migraine 11/27/2017  . GERD (gastroesophageal reflux disease)   . Neonatal stroke [facility])     Patient Active Problem List   Diagnosis Date Noted  . Common migraine 11/27/2017  . History of seizure 11/27/2017  . Anxiety state 09/26/2016  . VP (ventriculoperitoneal) shunt status 09/26/2016  . History of hydrocephalus as a child 09/26/2016  . 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 Difficulty with activities of daily living 09/26/2016  . Spasticity 09/26/2016  . Abnormality of gait 09/26/2016  . Cerebral palsy, hemiplegic ([place]) 08/12/2014  . Asthma, chronic 08/12/2014  . GERD (gastroesophageal reflux disease) 08/12/2014    Past Surgical History:  Procedure Laterality Date  . SHUNT REPLACEMENT Left    Shunt replaced in 2002  . TENDON RELEASE Right     OB History   None      Home Medications    Prior to Admission medications   Medication Sig Start Date End Date Taking? Authorizing Provider  albuterol (PROVENTIL) (2.5 MG/3ML) 0.083% nebulizer solution Inhale 3 mLs into the lungs every 4 (four) hours as needed for wheezing (cough.).   05/24/16   [provider]  azithromycin (ZITHROMAX) 250 MG tablet Take 1 tablet (250 mg total) by mouth daily. Take first 2 tablets together, then 1 every day until finished. 04/24/18   Larinda Herter C, PA-C  brompheniramine-pseudoephedrine-DM 30-2-10 MG/5ML syrup Take 5 mLs by mouth 4 (four) times daily as needed. 04/24/18   Demontray Franta C, PA-C  budesonide-formoterol (SYMBICORT) 160-4.5 MCG/ACT inhaler Inhale 2 puffs into the lungs 2 (two) times daily. 11/20/17   Kozlow, Donnamarie Poag, MD  cetirizine (ZYRTEC) 10 MG tablet Take by mouth.    [provider]  cyclobenzaprine (FLEXERIL) 5 MG tablet Take 5 mg by mouth 2 (two) times daily. As directed. 05/13/16   [provider]  fluticasone (FLONASE) 50 MCG/ACT nasal spray Place 1-2 sprays into both nostrils daily. 04/24/18   Maurene Hollin C, PA-C  GuanFACINE HCl 3 MG TB24 Take 3 mg by mouth daily. 05/24/16   [provider]  ibuprofen (ADVIL,MOTRIN) 600 MG tablet Take 600 mg by mouth every 6 (six) hours as needed for pain. 05/13/16   [provider]  montelukast (SINGULAIR) 10 MG tablet Take 1 tablet (10 mg total) by mouth at bedtime. 07/04/16   Kozlow, Donnamarie Poag, MD  norethindrone-ethinyl estradiol (JUNEL FE,GILDESS FE,LOESTRIN FE) 1-20 MG-MCG tablet Take by mouth.    [provider]  omeprazole (PRILOSEC) 40 MG capsule Take 1 capsule (40 mg total) by mouth 2 (two) times daily. 11/20/17   Kozlow, Donnamarie Poag, MD  polyethylene glycol powder [facility])  powder 1/2 TO 1 CAPFUL IN 8OZ BEVERAGE DAILY AS NEEDED 09/06/16   [provider]  PROAIR HFA 108 (90 Base) MCG/ACT inhaler Inhale two puffs every 4-6 hours if needed for cough or wheeze 04/24/18   Hadleigh Felber, [place] C, PA-C  QVAR 40 MCG/ACT inhaler inhale 2 puffs by mouth twice a day 08/11/16   Kozlow, Donnamarie Poag, MD  ranitidine (ZANTAC) 300 MG tablet Take 1 tablet (300 mg total) by mouth at bedtime. 11/20/17   Kozlow, Donnamarie Poag, MD  SETLAKIN 0.15-0.03 MG tablet  Take 1 tablet by mouth daily. 05/23/16   [provider]  SUMAtriptan (IMITREX) 100 MG tablet Take 1 tablet at onset of migraine. Can repeat once in 2 hr if needed. No more than 2 tabs in 24 hr. 04/08/18   Kathrynn Ducking, MD  triamcinolone cream (KENALOG) 0.1 % Apply 1 application topically 2 (two) times daily. 05/24/16   [provider]    Family History Family History  Problem Relation Age of Onset  . Migraines Mother   . Anxiety disorder Mother   . Bipolar disorder Neg Hx   . Schizophrenia Neg Hx   . Depression Neg Hx   . ADD / ADHD Neg Hx   . Autism Neg Hx   . Seizures Neg Hx     Social History Social History   Tobacco Use  . Smoking status: Never Smoker  . Smokeless tobacco: Never Used  Substance Use Topics  . Alcohol use: No  . Drug use: No     Allergies   Molds & smuts and Other   Review of Systems Review of Systems  Constitutional: Negative for activity change, appetite change, chills, fatigue and fever.  HENT: Positive for congestion and rhinorrhea. Negative for ear pain, sinus pressure, sore throat and trouble swallowing.   Eyes: Negative for discharge and redness.  Respiratory: Positive for cough and wheezing. Negative for chest tightness and shortness of breath.   Cardiovascular: Negative for chest pain.  Gastrointestinal: Negative for abdominal pain, diarrhea, nausea and vomiting.  Musculoskeletal: Negative for myalgias.  Skin: Negative for rash.  Neurological: Negative for dizziness, light-headedness and headaches.     Physical Exam Triage Vital Signs ED Triage Vitals  Enc Vitals Group     BP 04/24/18 1557 104/67     Pulse Rate 04/24/18 1557 71     Resp 04/24/18 1557 20     Temp 04/24/18 1557 98.9 F (37.2 C)     Temp Source 04/24/18 1557 Temporal     SpO2 04/24/18 1557 99 %     Weight --      Height --      Head Circumference --      Peak Flow --      Pain Score 04/24/18 1559 0     Pain Loc --      Pain Edu? --       Excl. in [place]? --    No data found.  Updated Vital Signs BP 104/67 (BP Location: Left Arm)   Pulse 71   Temp 98.9 F (37.2 C) (Temporal)   Resp 20   LMP  (LMP Unknown)   SpO2 99%   Visual Acuity Right Eye Distance:   Left Eye Distance:   Bilateral Distance:    Right Eye Near:   Left Eye Near:    Bilateral Near:     Physical Exam  Constitutional: She appears well-developed and well-nourished. No distress.  HENT:  Head: Normocephalic and atraumatic.  Bilateral ears without tenderness to palpation of external auricle, tragus and mastoid, EAC's without erythema or swelling, TM's with good bony landmarks and cone of light. Non erythematous.  Oral mucosa pink and moist, no tonsillar enlargement or exudate. Posterior pharynx patent and erythematous, no uvula deviation or swelling. Normal phonation.  Eyes: Conjunctivae are normal.  Neck: Neck supple.  Cardiovascular: Normal rate and regular rhythm.  No murmur heard. Pulmonary/Chest: Effort normal and breath sounds normal. No respiratory distress.  Breathing comfortably at rest, CTABL, no wheezing, rales or other adventitious sounds auscultated  Abdominal: Soft. There is no tenderness.  Musculoskeletal: She exhibits no edema.  Neurological: She is alert.  Skin: Skin is warm and dry.  Psychiatric: She has a normal mood and affect.  Nursing note and vitals reviewed.    UC Treatments / Results  Labs (all labs ordered are listed, but only abnormal results are displayed) Labs Reviewed - No data to display  EKG None  Radiology No results found.  Procedures Procedures (including critical care time)  Medications Ordered in UC Medications - No data to display  Initial Impression / Assessment and Plan / UC Course  I have reviewed the triage vital signs and the nursing notes.  Pertinent labs & imaging results that were available during my care of the patient were reviewed by me and considered in my medical decision making  (see chart for details).     Patient with persistent URI symptoms for 2 weeks, main symptom is cough and having occasional wheezing especially at nighttime, no wheezing heard on exam.  Will treat with azithromycin given the length of symptoms, continue Zyrtec and Flonase, provided cough syrup, continue albuterol.  Will hold off on prednisone given lungs CTA BL.  Discussed cough often will linger.  Patient stable, vital signs stable, no acute distress.  Discussed strict return precautions. Patient verbalized understanding and is agreeable with plan.  Final Clinical Impressions(s) / UC Diagnoses   Final diagnoses:  Acute bronchitis, unspecified organism     Discharge Instructions     Please begin azithromycin, 2 tablets today, 1 tablet for the following 4 days Continue to use Zyrtec, Flonase Please use cough syrup as needed every 8 hours Albuterol as needed  Cough may linger around, but as long as it is slowly improving in frequency and intensity  Please follow-up if developing fevers again, difficulty breathing, shortness of breath, wheezing, symptoms persisting without improvement   ED Prescriptions    Medication Sig Dispense Auth. Provider   azithromycin (ZITHROMAX) 250 MG tablet Take 1 tablet (250 mg total) by mouth daily. Take first 2 tablets together, then 1 every day until finished. 6 tablet Jozsef Wescoat C, PA-C   fluticasone (FLONASE) 50 MCG/ACT nasal spray Place 1-2 sprays into both nostrils daily. 16 g Derrill Bagnell C, PA-C   brompheniramine-pseudoephedrine-DM 30-2-10 MG/5ML syrup Take 5 mLs by mouth 4 (four) times daily as needed. 120 mL Yariah Selvey C, PA-C   PROAIR HFA 108 (90 Base) MCG/ACT inhaler Inhale two puffs every 4-6 hours if needed for cough or wheeze 8.5 g Baylee Mccorkel C, PA-C     Controlled Substance Prescriptions [place] Controlled Substance Registry consulted? Not Applicable   Janith Lima, [place] 04/24/18 1713

## 2018-05-11 DIAGNOSIS — E611 Iron deficiency: Secondary | ICD-10-CM | POA: Insufficient documentation

## 2018-05-20 ENCOUNTER — Telehealth: Payer: Self-pay | Admitting: Neurology

## 2018-05-20 NOTE — Telephone Encounter (Signed)
We have attempted to call the patient 2 times to schedule sleep study. Patient has been unavailable at the phone numbers we have on file and has not returned our calls. At this point we will send a letter asking pt to please contact the sleep lab to schedule their sleep study. If patient calls back we will schedule them for their sleep study. ° °

## 2018-05-23 ENCOUNTER — Other Ambulatory Visit: Payer: Self-pay | Admitting: Neurology

## 2018-05-23 ENCOUNTER — Other Ambulatory Visit: Payer: Self-pay | Admitting: Allergy and Immunology

## 2018-05-23 NOTE — Telephone Encounter (Signed)
courtesy

## 2018-05-28 NOTE — Progress Notes (Signed)
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  She has a history of seizures but none since 2005 and she is currently not on seizure medication she is gained about 100 pounds in the last couple years according to the mom.  BMI is 40 she has had a sleep evaluation but has not scheduled her sleep study.  She takes Imitrex for her migraine headaches she continues to have excessive daytime drowsiness.  She snores at night.  She returns for reevaluation   11/27/17 Mia Meyer is a 22 year old right-handed black female with a history of cerebral palsy with a right hemiparesis.  The patient has required a VP shunt placement, she is followed through [facility].  The patient has a history of seizures since she was around 74 month old, she has not had any seizures since 2005, she is currently off of seizure medications.  She does have Diastat to take if needed.  The mother indicates that when the patient has a seizure she will turn her head to the left and she will go limp and may fall over.  She does not convulse.  The patient last had MRI of the brain on 08 May 2017 and her shunt was stable at that time.  The patient has developed some problems with migraine headaches that may occur on average once or twice a month.  The migraine headaches last for several hours, but the patient will sleep up to the day of the headache.  The mother goes on to say that the patient sleeps a lot anyway, she oftentimes will sleep through classes in school.  The patient does snore some at night.  The patient will take Tylenol or ibuprofen for the headache without much benefit, she occasionally have some  nausea and vomiting.  Chocolate may activate the headache.  She does have some chronic gait instability associated with her right hemiparesis.  She denies any numbness of the extremities, she will fall on occasion.  She has chronic constipation issues and she does have some urinary urgency and occasional incontinence.  The patient is sent through this office for further evaluation.  REVIEW OF SYSTEMS: Full 14 system review of systems performed and notable only for those listed, all others are neg:  Constitutional: Daytime drowsiness Cardiovascular: neg Ear/Nose/Throat: neg  Skin: neg Eyes: neg Respiratory: neg Gastroitestinal: neg  Hematology/Lymphatic: neg  Endocrine: neg Musculoskeletal:neg Allergy/Immunology: neg Neurological: Headaches  psychiatric: neg Sleep : Snoring   ALLERGIES: Allergies  Allergen Reactions  . Molds & Smuts Swelling    Cough.   . Other     Seasonal Allergies      HOME MEDICATIONS: Outpatient Medications Prior to Visit  Medication Sig Dispense Refill  . albuterol (PROVENTIL) (2.5 MG/3ML) 0.083% nebulizer solution Inhale 3 mLs into the lungs every 4 (four) hours as needed for wheezing (cough.).   0  . cetirizine (ZYRTEC) 10 MG tablet Take by mouth.    . cyclobenzaprine (FLEXERIL) 5 MG tablet Take 5 mg by mouth 2 (two) times daily. As directed.  0  . fluticasone (FLONASE) 50 MCG/ACT nasal spray Place 1-2 sprays into both nostrils daily. 16 g 0  . GuanFACINE HCl 3 MG TB24 Take 3 mg by mouth daily.  0  .  ibuprofen (ADVIL,MOTRIN) 600 MG tablet Take 600 mg by mouth every 6 (six) hours as needed for pain.  0  . montelukast (SINGULAIR) 10 MG tablet Take 1 tablet (10 mg total) by mouth at bedtime. 30 tablet 5  . norethindrone-ethinyl estradiol (JUNEL FE,GILDESS FE,LOESTRIN FE) 1-20 MG-MCG tablet Take by mouth.    Marland Kitchen omeprazole (PRILOSEC) 40 MG capsule TAKE 1 CAPSULE(40 MG) BY MOUTH TWICE DAILY 60 capsule 0  . polyethylene glycol powder (GLYCOLAX/MIRALAX) powder  1/2 TO 1 CAPFUL IN 8OZ BEVERAGE DAILY AS NEEDED  0  . PROAIR HFA 108 (90 Base) MCG/ACT inhaler Inhale two puffs every 4-6 hours if needed for cough or wheeze 8.5 g 1  . QVAR 40 MCG/ACT inhaler inhale 2 puffs by mouth twice a day 8.7 g 3  . ranitidine (ZANTAC) 300 MG tablet Take 1 tablet (300 mg total) by mouth at bedtime. 30 tablet 5  . SETLAKIN 0.15-0.03 MG tablet Take 1 tablet by mouth daily.  0  . SUMAtriptan (IMITREX) 100 MG tablet Take 1 tablet at onset of migraine. Can repeat once in 2 hr if needed. No more than 2 tabs in 24 hr. 10 tablet 2  . SYMBICORT 160-4.5 MCG/ACT inhaler INHALE 2 PUFFS INTO THE LUNGS TWICE DAILY 10.2 g 0  . triamcinolone cream (KENALOG) 0.1 % Apply 1 application topically 2 (two) times daily.  0  . azithromycin (ZITHROMAX) 250 MG tablet Take 1 tablet (250 mg total) by mouth daily. Take first 2 tablets together, then 1 every day until finished. (Patient not taking: Reported on 05/30/2018) 6 tablet 0  . brompheniramine-pseudoephedrine-DM 30-2-10 MG/5ML syrup Take 5 mLs by mouth 4 (four) times daily as needed. (Patient not taking: Reported on 05/30/2018) 120 mL 0   No facility-administered medications prior to visit.     PAST MEDICAL HISTORY: Past Medical History:  Diagnosis Date  . Allergy    mold allergy  . Asthma   . Cerebral palsy ([place])   . Common migraine 11/27/2017  . GERD (gastroesophageal reflux disease)   . Neonatal stroke [facility])     PAST SURGICAL HISTORY: Past Surgical History:  Procedure Laterality Date  . SHUNT REPLACEMENT Left    Shunt replaced in 2002  . TENDON RELEASE Right     FAMILY HISTORY: Family History  Problem Relation Age of Onset  . Migraines Mother   . Anxiety disorder Mother   . Bipolar disorder Neg Hx   . Schizophrenia Neg Hx   . Depression Neg Hx   . ADD / ADHD Neg Hx   . Autism Neg Hx   . Seizures Neg Hx     SOCIAL HISTORY: Social History   Socioeconomic History  . Marital status: Single    Spouse name: Not on  file  . Number of children: 0  . Years of education: 68  . Highest education level: Not on file  Occupational History  . Not on file  Social Needs  . Financial resource strain: Not on file  . Food insecurity:    Worry: Not on file    Inability: Not on file  . Transportation needs:    Medical: Not on file    Non-medical: Not on file  Tobacco Use  . Smoking status: Never Smoker  . Smokeless tobacco: Never Used  Substance and Sexual Activity  . Alcohol use: No  . Drug use: No  . Sexual activity: Never    Birth control/protection: Pill  Lifestyle  . Physical activity:    Days  per week: Not on file    Minutes per session: Not on file  . Stress: Not on file  Relationships  . Social connections:    Talks on phone: Not on file    Gets together: Not on file    Attends religious service: Not on file    Active member of club or organization: Not on file    Attends meetings of clubs or organizations: Not on file    Relationship status: Not on file  . Intimate partner violence:    Fear of current or ex partner: Not on file    Emotionally abused: Not on file    Physically abused: Not on file    Forced sexual activity: Not on file  Other Topics Concern  . Not on file  Social History Narrative   Lives w/ mother and sister   Caffeine use: 1-2 times per day    Left handed   Vernell Morgans is in the 12 th grade at [employer]; doing well.      PHYSICAL EXAM  Vitals:   05/30/18 1058  BP: (!) 92/50  Pulse: 85  Weight: 231 lb (104.8 kg)  Height: 5\' 3"  (1.6 m)   Body mass index is 40.92 kg/m.  Generalized: Well developed, obese female in no acute distress  Head: normocephalic and atraumatic,. Oropharynx benign  Neck: Supple,   Musculoskeletal: Atrophy of the right arm and right leg noted as compared to the left  Neurological examination   Mentation: Alert oriented to time, place, history taking. Attention span and concentration appropriate. Recent and remote memory intact.   Follows all commands speech and language fluent.   Cranial nerve II-XII: Pupils were equal round reactive to light extraocular movements were full, visual field were full on confrontational test. Facial sensation and strength were normal. hearing was intact to finger rubbing bilaterally. Uvula tongue midline. head turning and shoulder shrug were normal and symmetric.Tongue protrusion into cheek strength was normal. Motor: normal bulk and tone, full strength in the BUE, BLE, on the left on the right 4 out of 5 strength with decreased  grip strength.  Patient has a right foot drop  Sensory: normal and symmetric to light touch, pinprick, and  Vibration, in the upper and lower extremities Coordination: finger-nose-finger, with difficulty on the right Reflexes: Depressed upper and lower plantar responses were flexor bilaterally. Gait and Station: Rising up from seated position without assistance, circumcised duction type gait with the right leg.  Romberg negative DIAGNOSTIC DATA (LABS, IMAGING, TESTING) - ASSESSMENT AND PLAN  22 y.o. year old female  has a past medical history of    1.Cerebral palsy, right hemiparesis  2.  History of seizures  3.  Migraine headache  4.  Excessive daytime drowsiness  PLAN:  Continue Imitrex at current dose will refill Can increase Flexeril to 2 tabs at bedtime Schedule sleep study on the way out today Follow-up after the sleep study Mia Bible, [facility], [facility], [facility] [address], [place], [place] [address] [phone]

## 2018-05-29 ENCOUNTER — Ambulatory Visit: Payer: Medicaid Other | Admitting: Allergy

## 2018-05-30 ENCOUNTER — Ambulatory Visit: Payer: Medicaid Other | Admitting: Nurse Practitioner

## 2018-05-30 VITALS — BP 92/50 | HR 85 | Ht 63.0 in | Wt 231.0 lb

## 2018-05-30 DIAGNOSIS — R269 Unspecified abnormalities of gait and mobility: Secondary | ICD-10-CM

## 2018-05-30 DIAGNOSIS — G43009 Migraine without aura, not intractable, without status migrainosus: Secondary | ICD-10-CM | POA: Diagnosis not present

## 2018-05-30 DIAGNOSIS — R400 Somnolence: Secondary | ICD-10-CM

## 2018-05-30 DIAGNOSIS — G808 Other cerebral palsy: Secondary | ICD-10-CM | POA: Diagnosis not present

## 2018-05-30 DIAGNOSIS — Z87898 Personal history of other specified conditions: Secondary | ICD-10-CM

## 2018-05-30 MED ORDER — SUMATRIPTAN SUCCINATE 100 MG PO TABS: ORAL_TABLET | ORAL | 3 refills | Status: DC

## 2018-05-30 NOTE — Patient Instructions (Signed)
Continue Imitrex at current dose Can increase Flexeril to 2 tabs at bedtime Schedule sleep study on the way out today Follow-up after the sleep study

## 2018-05-30 NOTE — Progress Notes (Signed)
I have read the note, and I agree with the clinical assessment and plan.  Charles K Willis   

## 2018-06-14 ENCOUNTER — Ambulatory Visit (INDEPENDENT_AMBULATORY_CARE_PROVIDER_SITE_OTHER): Payer: Medicare Other | Admitting: Neurology

## 2018-06-14 DIAGNOSIS — G4719 Other hypersomnia: Secondary | ICD-10-CM

## 2018-06-14 DIAGNOSIS — R351 Nocturia: Secondary | ICD-10-CM

## 2018-06-14 DIAGNOSIS — G4720 Circadian rhythm sleep disorder, unspecified type: Secondary | ICD-10-CM

## 2018-06-14 DIAGNOSIS — G4710 Hypersomnia, unspecified: Secondary | ICD-10-CM

## 2018-06-14 DIAGNOSIS — R0683 Snoring: Secondary | ICD-10-CM

## 2018-06-14 DIAGNOSIS — Z72821 Inadequate sleep hygiene: Secondary | ICD-10-CM

## 2018-06-14 DIAGNOSIS — E669 Obesity, unspecified: Secondary | ICD-10-CM

## 2018-06-19 ENCOUNTER — Telehealth: Payer: Self-pay

## 2018-06-19 IMAGING — CR DG TIBIA/FIBULA 2V*L*
4 series · 4 of 4 positions shown · non-contrast
Comparison: None.

CLINICAL DATA: Status post trip and fall today with a blow to the
left lower leg. Pain. Initial encounter.

EXAM:
LEFT TIBIA AND FIBULA - 2 VIEW

[t tib-fib ap left (1 of 2)]
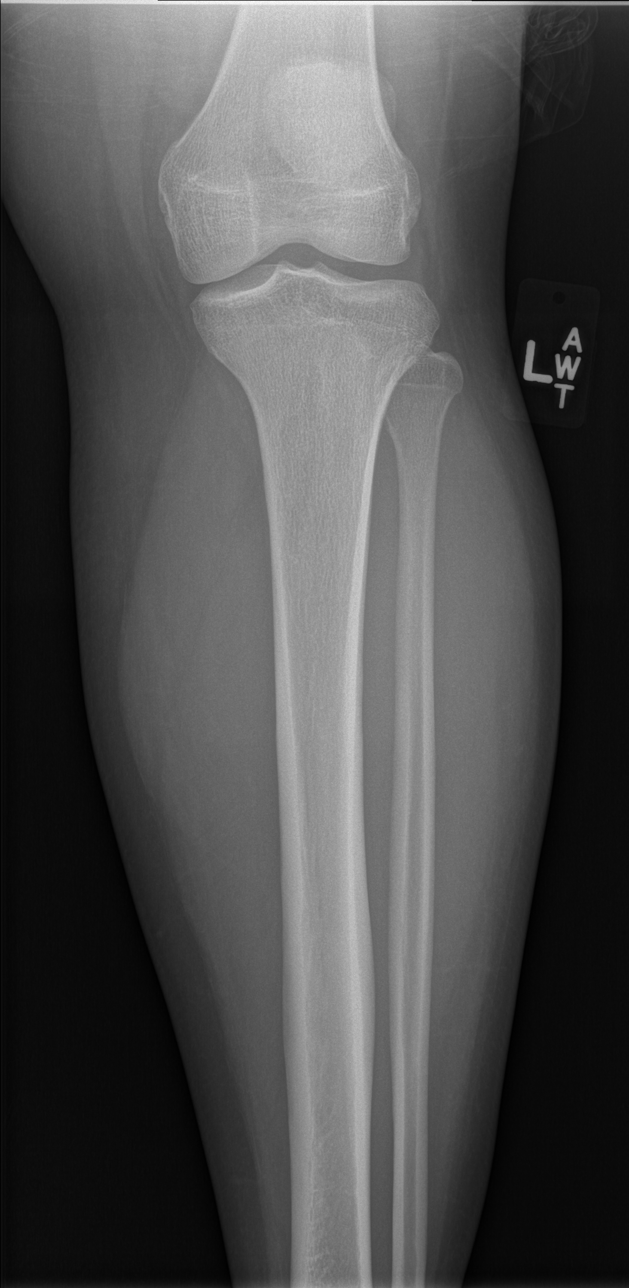

[t tib-fib ap left (2 of 2)]
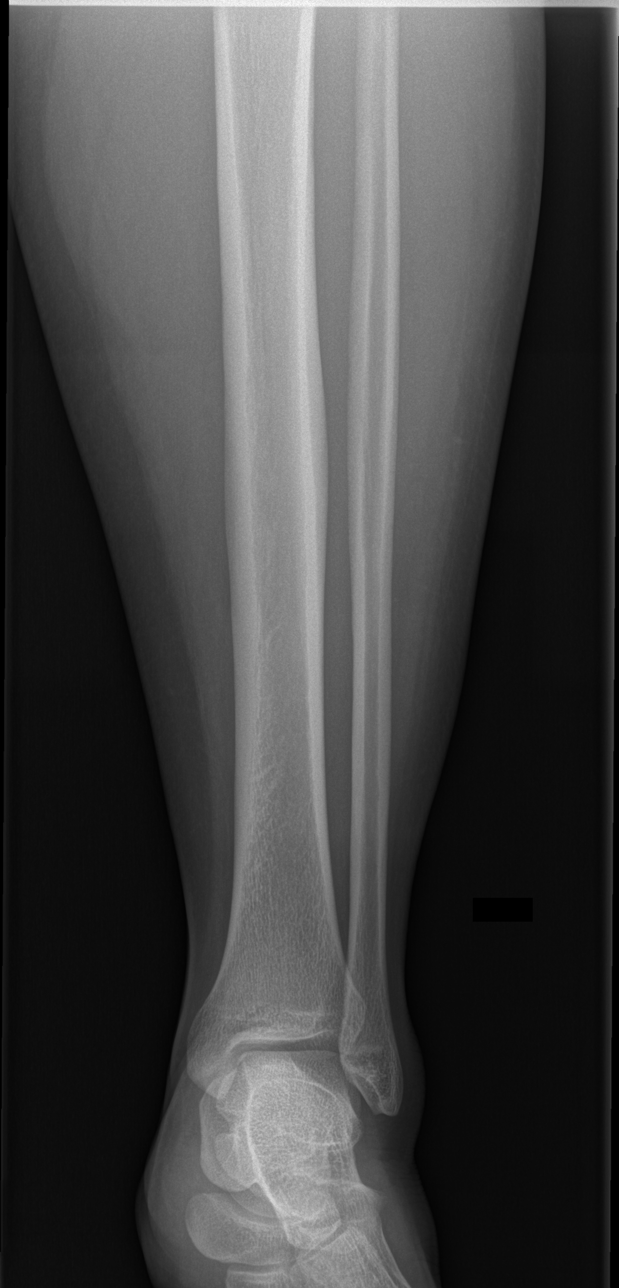

[t tib-fib lat left (1 of 2)]
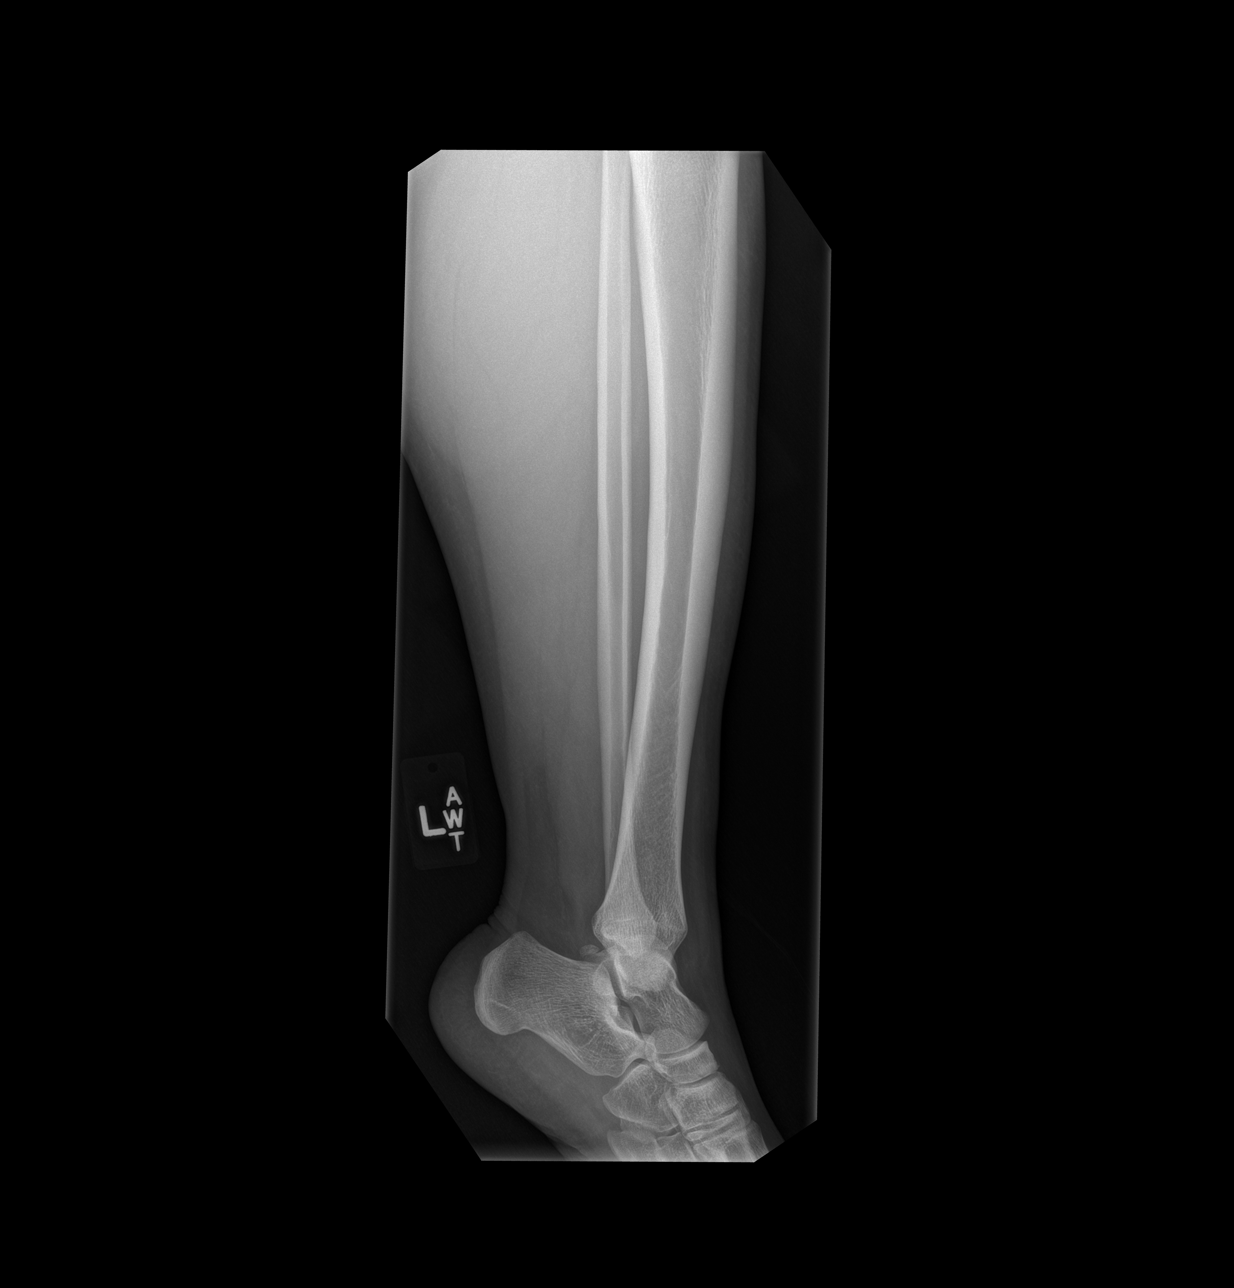

[t tib-fib lat left (2 of 2)]
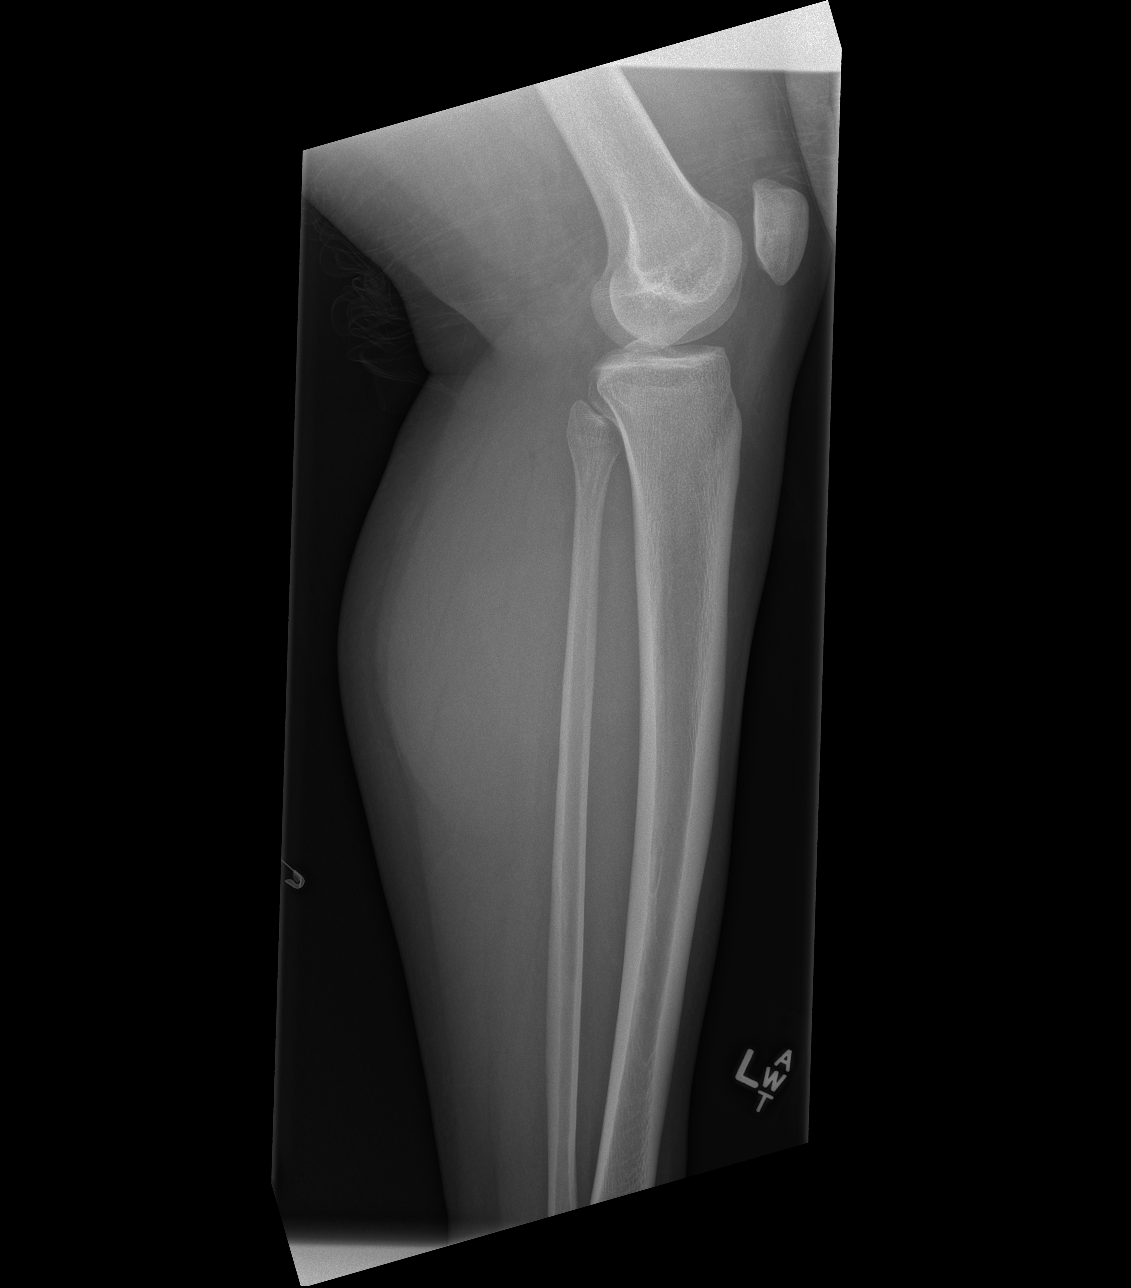

[4 of 4 positions shown; findings below may reference images not displayed]

FINDINGS: There is no evidence of fracture or other focal bone lesions. Soft
tissues are unremarkable.
IMPRESSION: Negative exam.

## 2018-06-19 NOTE — Telephone Encounter (Signed)
I called pt, spoke to pt's mother per DPR. I advised pt's mother that Dr. Rexene Alberts reviewed pt's sleep study and found that pt did not show any significant osa with the exception of snoring. Dr. Rexene Alberts recommends that pt pursue weight loss and perhaps seek an ENT's opinion regarding snoring if needed. Otherwise, pt should follow up with Dr. Jannifer Franklin and her PCP. I reviewed sleep hygiene recommendations with the pt's mother, including trying to keep a regular sleep wake schedule, avoiding electronics in the bedroom, keeping the bedroom cool, dark, and quiet, and avoiding eating or exercising within 2 hours of bedtime as well as eating in the middle of the night. I advised pt to keep pets out of the bedroom. I discussed with pt the importance of stress relief and to try meditation, deep breathing exercises, and/or a white noise machine or fan to diffuse other noise distractors. I advised pt to not drink alcohol before bedtime and to never mix alcohol and sedating medications. Pt was advised to avoid narcotic pain medication close to bedtime. I advised pt that a copy of these sleep study results will be sent to Dr. Jannifer Franklin. Pt's mother verbalized understanding of results. Pt's mother had no questions at this time but was encouraged to call back if questions arise.

## 2018-06-19 NOTE — Progress Notes (Signed)
Patient referred by Dr. Jannifer Franklin, seen by me on 04/10/18, diagnostic PSG on 06/14/18.   Please call and notify the patient that the recent sleep study did not show any significant obstructive sleep apnea with the exception of snoring. Weight loss is recommended and evaluation with ENT may be feasible for snoring. Please inform patient that she can FU with Dr. Jannifer Franklin and PCP as scheduled.  Please remind patient to try to maintain good sleep hygiene, which means: Keep a regular sleep and wake schedule and make enough time for sleep (7 1/2 to 8 1/2 hours for the average adult), try not to exercise or have a meal within 2 hours of your bedtime, try to keep your bedroom conducive for sleep, that is, cool and dark, without light distractors such as an illuminated alarm clock, and refrain from watching TV right before sleep or in the middle of the night and do not keep the TV or radio on during the night. If a nightlight is used, have it away from the visual field. Also, try not to use or play on electronic devices at bedtime, such as your cell phone, tablet PC or laptop. If you like to read at bedtime on an electronic device, try to dim the background light as much as possible. Do not eat in the middle of the night. Keep pets away from the bedroom environment. For stress relief, try meditation, deep breathing exercises (there are many books and CDs available), a white noise machine or fan can help to diffuse other noise distractors, such as traffic noise. Do not drink alcohol before bedtime, as it can disturb sleep and cause middle of the night awakenings. Never mix alcohol and sedating medications! Avoid narcotic pain medication close to bedtime, as opioids/narcotics can suppress breathing drive and breathing effort.    Mia Age, MD, PhD [facility])

## 2018-06-19 NOTE — Procedures (Signed)
PATIENT'S NAME:  Mia Meyer, Mia Meyer DOB:      [birth year]      MR#:    [id number]     DATE OF RECORDING: 06/14/2018 REFERRING M.D.:  Margette Fast, MD Study Performed:   Baseline Polysomnogram HISTORY: 22 year old female with a history of seizures, migraine headaches, cerebral palsy with right-sided hemiparesis, VP shunt placement and obesity, who, per mom, has snoring and daytime somnolence. The Epworth Sleepiness Scale is 19/24. The patient's weight 225 pounds with a height of 63 (inches), resulting in a BMI of 39.8 kg/m2. The patient's neck circumference measured 16.6 inches.  CURRENT MEDICATIONS: Proventil, Symbicort, Zyrtec, Flexeril, Diastat Acudial, Flonase, Guanfacine, Advil, Singulair, Junel FE, Prilosec, Glycolax, Proair, QVAR, Zantac, Setlakin, Imitrex, Kenalog.   PROCEDURE:  This is a multichannel digital polysomnogram utilizing the Somnostar 11.2 system.  Electrodes and sensors were applied and monitored per AASM Specifications.   EEG, EOG, Chin and Limb EMG, were sampled at 200 Hz.  ECG, Snore and Nasal Pressure, Thermal Airflow, Respiratory Effort, CPAP Flow and Pressure, Oximetry was sampled at 50 Hz. Digital video and audio were recorded.      BASELINE STUDY  Lights Out was at 22:37 and Lights On at 04:53.  Total recording time (TRT) was 376 minutes, with a total sleep time (TST) of 354 minutes. The patient's sleep latency was 7.5 minutes. REM latency was 124 minutes, which is mildly delayed. The sleep efficiency was 94.1%.     SLEEP ARCHITECTURE: WASO (Wake after sleep onset) was 14 minutes with no significant sleep fragmentation noted. There were 2.5 minutes in Stage N1, 175 minutes Stage N2, 126.5 minutes Stage N3 and 50 minutes in Stage REM. The percentage of Stage N1 was .7%, Stage N2 was 49.4%, which is normal, Stage N3 was 35.7%, which is increased, and Stage R (REM sleep) was 14.1%, which is reduced. The arousals were noted as: 9 were spontaneous, 0 were associated with PLMs, 1  were associated with respiratory events.  RESPIRATORY ANALYSIS:  There were a total of 10 respiratory events:  1 obstructive apneas, 5 central apneas and 0 mixed apneas with a total of 6 apneas and an apnea index (AI) of 1. /hour. There were 4 hypopneas with a hypopnea index of .7 /hour. The patient also had 0 respiratory event related arousals (RERAs).      The total APNEA/HYPOPNEA INDEX (AHI) was 1.7/hour and the total RESPIRATORY DISTURBANCE INDEX was 1.7 /hour.  3 events occurred in REM sleep and 10 events in NREM. The REM AHI was 3.6 /hour, versus a non-REM AHI of 1.4. The patient spent 211.5 minutes of total sleep time in the supine position and 143 minutes in non-supine.. The supine AHI was 1.4 versus a non-supine AHI of 2.1.  OXYGEN SATURATION & C02:  The Wake baseline 02 saturation was 96%, with the lowest being 83%. Time spent below 89% saturation equaled 9 minutes (likely overestimated, due to loss of sensor).  PERIODIC LIMB MOVEMENTS: The patient had a total of 0 Periodic Limb Movements.  The Periodic Limb Movement (PLM) index was 0 and the PLM Arousal index was 0/hour.  Audio and video analysis did not show any abnormal or unusual movements, behaviors, phonations or vocalizations. The patient took no bathroom breaks. Mild to moderate snoring was noted. The EKG was in keeping with normal sinus rhythm (NSR).  Post-study, the patient indicated that sleep was the same as usual.   IMPRESSION:  1. Primary Snoring 2. Dysfunctions associated with sleep stages or arousal from sleep  RECOMMENDATIONS:  1. This study does not demonstrate any significant obstructive or central sleep disordered breathing with the exception of snoring. Weight loss is recommended and evaluation with ENT may be feasible for snoring. This study does not support an intrinsic sleep disorder as a cause of the patient's symptoms. Other causes, including circadian rhythm disturbances, an underlying mood disorder,  medication effect and/or an underlying medical problem cannot be ruled out. 2. This study shows no significant sleep fragmentation and mildly abnormal sleep stage percentages; these are nonspecific findings and per se do not signify an intrinsic sleep disorder or a cause for the patient's sleep-related symptoms. Causes include (but are not limited to) the first night effect of the sleep study, circadian rhythm disturbances, medication effect or an underlying mood disorder or medical problem.  3. The patient should be cautioned not to drive, work at heights, or operate dangerous or heavy equipment when tired or sleepy. Review and reiteration of good sleep hygiene measures should be pursued with any patient. 4. The patient will be advised to follow up with the referring provider, who will be notified of the test results.  I certify that I have reviewed the entire raw data recording prior to the issuance of this report in accordance with the Standards of Accreditation of the American Academy of Sleep Medicine (AASM)     Star Age, MD, PhD Diplomat, American Board of Neurology and Sleep Medicine (Neurology and Sleep Medicine)

## 2018-06-19 NOTE — Telephone Encounter (Signed)
-----   Message from Star Age, MD sent at 06/19/2018  8:29 AM EST ----- Patient referred by Dr. Jannifer Franklin, seen by me on 04/10/18, diagnostic PSG on 06/14/18.   Please call and notify the patient that the recent sleep study did not show any significant obstructive sleep apnea with the exception of snoring. Weight loss is recommended and evaluation with ENT may be feasible for snoring. Please inform patient that she can FU with Dr. Jannifer Franklin and PCP as scheduled.  Please remind patient to try to maintain good sleep hygiene, which means: Keep a regular sleep and wake schedule and make enough time for sleep (7 1/2 to 8 1/2 hours for the average adult), try not to exercise or have a meal within 2 hours of your bedtime, try to keep your bedroom conducive for sleep, that is, cool and dark, without light distractors such as an illuminated alarm clock, and refrain from watching TV right before sleep or in the middle of the night and do not keep the TV or radio on during the night. If a nightlight is used, have it away from the visual field. Also, try not to use or play on electronic devices at bedtime, such as your cell phone, tablet PC or laptop. If you like to read at bedtime on an electronic device, try to dim the background light as much as possible. Do not eat in the middle of the night. Keep pets away from the bedroom environment. For stress relief, try meditation, deep breathing exercises (there are many books and CDs available), a white noise machine or fan can help to diffuse other noise distractors, such as traffic noise. Do not drink alcohol before bedtime, as it can disturb sleep and cause middle of the night awakenings. Never mix alcohol and sedating medications! Avoid narcotic pain medication close to bedtime, as opioids/narcotics can suppress breathing drive and breathing effort.    Star Age, MD, PhD [facility])

## 2018-06-26 ENCOUNTER — Other Ambulatory Visit: Payer: Self-pay | Admitting: Allergy and Immunology

## 2018-07-30 ENCOUNTER — Telehealth: Payer: Self-pay | Admitting: *Deleted

## 2018-07-30 MED ORDER — SUMATRIPTAN SUCCINATE 100 MG PO TABS: ORAL_TABLET | ORAL | 3 refills | Status: DC

## 2018-07-30 NOTE — Telephone Encounter (Signed)
Received notice that Sumatriptan is on formulary and is subject to quantity limit.  9/30 days is limit unless exception.

## 2018-08-09 DIAGNOSIS — N926 Irregular menstruation, unspecified: Secondary | ICD-10-CM | POA: Insufficient documentation

## 2018-08-29 ENCOUNTER — Other Ambulatory Visit: Payer: Self-pay | Admitting: Allergy and Immunology

## 2018-09-16 ENCOUNTER — Ambulatory Visit: Payer: Medicare Other | Admitting: Allergy

## 2018-09-16 ENCOUNTER — Encounter: Payer: Self-pay | Admitting: Allergy

## 2018-09-16 ENCOUNTER — Ambulatory Visit (INDEPENDENT_AMBULATORY_CARE_PROVIDER_SITE_OTHER): Payer: Medicare Other | Admitting: Allergy

## 2018-09-16 VITALS — BP 102/66 | HR 102 | Temp 97.8°F | Resp 17 | Ht 61.0 in | Wt 212.0 lb

## 2018-09-16 DIAGNOSIS — K219 Gastro-esophageal reflux disease without esophagitis: Secondary | ICD-10-CM | POA: Diagnosis not present

## 2018-09-16 DIAGNOSIS — J4540 Moderate persistent asthma, uncomplicated: Secondary | ICD-10-CM

## 2018-09-16 DIAGNOSIS — J3089 Other allergic rhinitis: Secondary | ICD-10-CM | POA: Diagnosis not present

## 2018-09-16 MED ORDER — FAMOTIDINE 40 MG PO TABS: 40.0000 mg | ORAL_TABLET | Freq: Every day | ORAL | 5 refills | Status: DC

## 2018-09-16 MED ORDER — AZELASTINE HCL 0.1 % NA SOLN: 1.0000 | Freq: Two times a day (BID) | NASAL | 5 refills | Status: DC

## 2018-09-16 MED ORDER — BECLOMETHASONE DIPROPIONATE 40 MCG/ACT IN AERS: 2.0000 | INHALATION_SPRAY | Freq: Two times a day (BID) | RESPIRATORY_TRACT | 3 refills | Status: DC

## 2018-09-16 MED ORDER — PROAIR HFA 108 (90 BASE) MCG/ACT IN AERS: INHALATION_SPRAY | RESPIRATORY_TRACT | 1 refills | Status: DC

## 2018-09-16 MED ORDER — FLUTICASONE PROPIONATE HFA 110 MCG/ACT IN AERO: 2.0000 | INHALATION_SPRAY | Freq: Two times a day (BID) | RESPIRATORY_TRACT | 5 refills | Status: DC

## 2018-09-16 MED ORDER — ALBUTEROL SULFATE (2.5 MG/3ML) 0.083% IN NEBU: 3.0000 mL | INHALATION_SOLUTION | RESPIRATORY_TRACT | 1 refills | Status: DC | PRN

## 2018-09-16 MED ORDER — BUDESONIDE-FORMOTEROL FUMARATE 160-4.5 MCG/ACT IN AERO: 2.0000 | INHALATION_SPRAY | Freq: Two times a day (BID) | RESPIRATORY_TRACT | 3 refills | Status: DC

## 2018-09-16 NOTE — Progress Notes (Signed)
Follow Up Note  RE: Mia Meyer MRN: [id number] DOB: 03-Oct-1995 Date of Office Visit: 09/16/2018  Referring provider: Sue Lush, PA-C Primary care provider: Sue Lush, PA-C  Chief Complaint: Cough  History of Present Illness: I had the pleasure of seeing Mia Meyer for a follow up visit at the [facility] [facility] [place] on 09/16/2018. She is a 23 y.o. female, who is being followed for asthma, allergic rhinitis, LPRD. Today she is here for new complaint of coughing and asthma. She is accompanied today by her mother who provided/contributed to the history. Her previous allergy office visit was on 11/20/2017 with Dr. Neldon Mc.   Asthma: Patient has been having issues with dry cough for the past 4 months. At times she had post tussive emesis with this. She is also having some wheezing and SOB with PND. No recent prednisone use. Denies any fevers or chills.   Currently she has been off her medications for a few days due to refill issues but previously she was taking Symbicort 160 2 puffs BID, nebulizer 4 times a day for 2 months with some benefit.   Still taking Singulair daily, zyrtec daily and Flonase 1 spray twice day.  LPRD: Currently on omeprazole 40mg  1-2 times a day and not taking zantac due to the recall issue.   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   Assessment and Plan: Ceil is a 23 y.o. female with: Not well controlled moderate persistent asthma Coughing with post tussive emesis at times, wheezing, SOB and PND for the past 4 months and using albuterol nebulizer daily the past 2 months. No recent prednisone use.  Today's spirometry showed: normal pattern and 13% improvement post bronchodilator treatment.  Her asthma is not well controlled. ACT score 12.  . Start prednisone taper . Daily controller medication(s): continue Symbicort 160 2 puffs twice a day with spacer and rinse mouth afterwards o Continue  Singulair 10mg  daily. . Prior to physical activity: May use albuterol rescue inhaler 2 puffs 5 to 15 minutes prior to strenuous physical activities. Marland Kitchen Rescue medications: May use albuterol rescue inhaler 2 puffs or nebulizer every 4 to 6 hours as needed for shortness of breath, chest tightness, coughing, and wheezing. Monitor frequency of use.  . During upper respiratory infections: Start Flovent 110  puffs twice a day with spacer and rinse mouth afterwards for 1-2 weeks. . Start mucinex to loosen up the mucous.   Other allergic rhinitis Stable symptoms with below regimen. No recent skin testing. Consider repeating it in the future.  Continue Fluticasone 1 spray in each nostril twice a day.  Start azelastine nasal spray 1-2 sprays twice a day as needed for drainage.  Nasal saline spray (i.e., Simply Saline) or nasal saline lavage (i.e., NeilMed) is recommended as needed and prior to medicated nasal sprays.  Continue zyrtec 10mg  daily.  LPRD (laryngopharyngeal reflux disease)  Continue omeprazole 40mg  daily.  Start Pepcid 40mg  daily.This replaces zantac.  Return in about 2 weeks (around 09/30/2018).  Meds ordered this encounter  Medications  . PROAIR HFA 108 (90 Base) MCG/ACT inhaler    Sig: Inhale two puffs every 4-6 hours if needed for cough or wheeze    Dispense:  8.5 g    Refill:  1  . albuterol (PROVENTIL) (2.5 MG/3ML) 0.083% nebulizer solution    Sig: Inhale 3 mLs into the lungs every 4 (four) hours as needed for wheezing (cough.).  Dispense:  75 mL    Refill:  1  . budesonide-formoterol (SYMBICORT) 160-4.5 MCG/ACT inhaler    Sig: Inhale 2 puffs into the lungs 2 (two) times daily.    Dispense:  10.2 g    Refill:  3    Patient must keep scheduled office visit for further refills.  . beclomethasone (QVAR) 40 MCG/ACT inhaler    Sig: Inhale 2 puffs into the lungs 2 (two) times daily.    Dispense:  8.7 g    Refill:  3  . azelastine (ASTELIN) 0.1 % nasal spray    Sig:  Place 1-2 sprays into both nostrils 2 (two) times daily. For drainage.    Dispense:  30 mL    Refill:  5  . famotidine (PEPCID) 40 MG tablet    Sig: Take 1 tablet (40 mg total) by mouth daily.    Dispense:  30 tablet    Refill:  5  . fluticasone (FLOVENT HFA) 110 MCG/ACT inhaler    Sig: Inhale 2 puffs into the lungs 2 (two) times daily.    Dispense:  1 Inhaler    Refill:  5   Diagnostics: Spirometry:  Tracings reviewed. Her effort: Good reproducible efforts. FVC: 2.62L FEV1: 2.00L, 77% predicted FEV1/FVC ratio: 76% Interpretation: Spirometry consistent with normal pattern and 13% improvement post bronchodilator treatment. Patient felt clinically improved.  Please see scanned spirometry results for details.  Medication List:  Current Outpatient Medications  Medication Sig Dispense Refill  . acetaminophen (TYLENOL) 500 MG tablet Take by mouth.    Marland Kitchen albuterol (PROVENTIL) (2.5 MG/3ML) 0.083% nebulizer solution Inhale 3 mLs into the lungs every 4 (four) hours as needed for wheezing (cough.). 75 mL 1  . amitriptyline (ELAVIL) 25 MG tablet TAKE 1 TABLET NIGHTLY. MAY START WITH 1/2 TABLET FOR A FEW DAYS    . beclomethasone (QVAR) 40 MCG/ACT inhaler Inhale 2 puffs into the lungs 2 (two) times daily. 8.7 g 3  . budesonide-formoterol (SYMBICORT) 160-4.5 MCG/ACT inhaler Inhale 2 puffs into the lungs 2 (two) times daily. 10.2 g 3  . cetirizine (ZYRTEC) 10 MG tablet Take by mouth.    . cyclobenzaprine (FLEXERIL) 5 MG tablet Take 5 mg by mouth 2 (two) times daily. As directed.  0  . diazepam (DIASTAT ACUDIAL) 10 MG GEL     . fluticasone (FLONASE) 50 MCG/ACT nasal spray Place 1-2 sprays into both nostrils daily. 16 g 0  . GuanFACINE HCl 3 MG TB24 Take 3 mg by mouth daily.  0  . ibuprofen (ADVIL,MOTRIN) 600 MG tablet Take 600 mg by mouth every 6 (six) hours as needed for pain.  0  . levETIRAcetam (KEPPRA) 750 MG tablet     . montelukast (SINGULAIR) 10 MG tablet Take 1 tablet (10 mg total) by mouth  at bedtime. 30 tablet 5  . norethindrone-ethinyl estradiol (JUNEL FE,GILDESS FE,LOESTRIN FE) 1-20 MG-MCG tablet Take by mouth.    Marland Kitchen omeprazole (PRILOSEC) 40 MG capsule TAKE 1 CAPSULE(40 MG) BY MOUTH TWICE DAILY 60 capsule 0  . ondansetron (ZOFRAN-ODT) 4 MG disintegrating tablet     . polyethylene glycol powder (GLYCOLAX/MIRALAX) powder 1/2 TO 1 CAPFUL IN 8OZ BEVERAGE DAILY AS NEEDED  0  . PROAIR HFA 108 (90 Base) MCG/ACT inhaler Inhale two puffs every 4-6 hours if needed for cough or wheeze 8.5 g 1  . ranitidine (ZANTAC) 300 MG tablet Take 1 tablet (300 mg total) by mouth at bedtime. 30 tablet 5  . SETLAKIN 0.15-0.03 MG tablet Take 1 tablet  by mouth daily.  0  . SUMAtriptan (IMITREX) 100 MG tablet Take 1 tablet at onset of migraine. Can repeat once in 2 hr if needed. No more than 2 tabs in 24 hr. 9 tablet 3  . triamcinolone cream (KENALOG) 0.1 % Apply 1 application topically 2 (two) times daily.  0  . azelastine (ASTELIN) 0.1 % nasal spray Place 1-2 sprays into both nostrils 2 (two) times daily. For drainage. 30 mL 5  . famotidine (PEPCID) 40 MG tablet Take 1 tablet (40 mg total) by mouth daily. 30 tablet 5  . fluticasone (FLOVENT HFA) 110 MCG/ACT inhaler Inhale 2 puffs into the lungs 2 (two) times daily. 1 Inhaler 5   No current facility-administered medications for this visit.    Allergies: Allergies  Allergen Reactions  . Molds & Smuts Swelling    Cough.   . Other     Seasonal Allergies     I reviewed her past medical history, social history, family history, and environmental history and no significant changes have been reported from previous visit on 11/20/2017.  Review of Systems  Constitutional: Negative for appetite change, chills, fever and unexpected weight change.  HENT: Positive for postnasal drip. Negative for congestion and rhinorrhea.   Eyes: Negative for itching.  Respiratory: Positive for cough, shortness of breath and wheezing. Negative for chest tightness.     Gastrointestinal: Negative for abdominal pain.  Skin: Negative for rash.  Neurological: Negative for headaches.   Objective: BP 102/66 (BP Location: Left Arm, Patient Position: Sitting, Cuff Size: Large)   Pulse (!) 102   Temp 97.8 F (36.6 C) (Core)   Resp 17   Ht 5\' 1"  (1.549 m)   Wt 212 lb (96.2 kg)   SpO2 98%   BMI 40.06 kg/m  Body mass index is 40.06 kg/m. Physical Exam  Constitutional: She is oriented to person, place, and time. She appears well-developed and well-nourished.  HENT:  Head: Normocephalic and atraumatic.  Right Ear: External ear normal.  Left Ear: External ear normal.  Nose: Nose normal.  Mouth/Throat: Oropharynx is clear and moist.  Eyes: Conjunctivae and EOM are normal.  Neck: Neck supple.  Cardiovascular: Normal rate, regular rhythm and normal heart sounds. Exam reveals no gallop and no friction rub.  No murmur heard. Pulmonary/Chest: Effort normal and breath sounds normal. She has no wheezes. She has no rales.  Lymphadenopathy:    She has no cervical adenopathy.  Neurological: She is alert and oriented to person, place, and time.  Skin: Skin is warm. No rash noted.  Psychiatric: She has a normal mood and affect. Her behavior is normal.  Nursing note and vitals reviewed.  Previous notes and tests were reviewed. The plan was reviewed with the patient/family, and all questions/concerned were addressed.  It was my pleasure to see Mia Meyer today and participate in her care. Please feel free to contact me with any questions or concerns.  Sincerely,  Rexene Alberts, DO Allergy & Immunology  [facility] office: [phone] [facility] office: [phone]

## 2018-09-16 NOTE — Assessment & Plan Note (Signed)
   Continue omeprazole 40mg  daily.  Start Pepcid 40mg  daily.This replaces zantac.

## 2018-09-16 NOTE — Assessment & Plan Note (Signed)
Stable symptoms with below regimen. No recent skin testing. Consider repeating it in the future.  Continue Fluticasone 1 spray in each nostril twice a day.  Start azelastine nasal spray 1-2 sprays twice a day as needed for drainage.  Nasal saline spray (i.e., Simply Saline) or nasal saline lavage (i.e., NeilMed) is recommended as needed and prior to medicated nasal sprays.  Continue zyrtec 10mg  daily.

## 2018-09-16 NOTE — Assessment & Plan Note (Addendum)
Coughing with post tussive emesis at times, wheezing, SOB and PND for the past 4 months and using albuterol nebulizer daily the past 2 months. No recent prednisone use.  Today's spirometry showed: normal pattern and 13% improvement post bronchodilator treatment.  Her asthma is not well controlled. ACT score 12.  . Start prednisone taper . Daily controller medication(s): continue Symbicort 160 2 puffs twice a day with spacer and rinse mouth afterwards o Continue Singulair 10mg  daily. . Prior to physical activity: May use albuterol rescue inhaler 2 puffs 5 to 15 minutes prior to strenuous physical activities. Marland Kitchen Rescue medications: May use albuterol rescue inhaler 2 puffs or nebulizer every 4 to 6 hours as needed for shortness of breath, chest tightness, coughing, and wheezing. Monitor frequency of use.  . During upper respiratory infections: Start Flovent 110  puffs twice a day with spacer and rinse mouth afterwards for 1-2 weeks. . Start mucinex to loosen up the mucous.

## 2018-09-16 NOTE — Patient Instructions (Addendum)
.   Start prednisone taper . Daily controller medication(s): continue Symbicort 160 2 puffs twice a day with spacer and rinse mouth afterwards o Continue Singulair 10mg  daily. . Prior to physical activity: May use albuterol rescue inhaler 2 puffs 5 to 15 minutes prior to strenuous physical activities. Marland Kitchen Rescue medications: May use albuterol rescue inhaler 2 puffs or nebulizer every 4 to 6 hours as needed for shortness of breath, chest tightness, coughing, and wheezing. Monitor frequency of use.  . During upper respiratory infections: Start Qvar 40 2 puffs twice a day with spacer and rinse mouth afterwards for 1-2 weeks. . Asthma control goals:  o Full participation in all desired activities (may need albuterol before activity) o Albuterol use two times or less a week on average (not counting use with activity) o Cough interfering with sleep two times or less a month o Oral steroids no more than once a year o No hospitalizations  Continue Fluticasone 1 spray in each nostril twice a day.  Start azelastine nasal spray 1-2 sprays twice a day as needed for drainage.  Nasal saline spray (i.e., Simply Saline) or nasal saline lavage (i.e., NeilMed) is recommended as needed and prior to medicated nasal sprays.  Continue omeprazole 40mg  daily  Start Pepcid 40mg  daily.This replaces zantac.  Continue zyrtec 10mg  daily.  Take mucinex 60mg  twice a day for thick mucous. And drink plenty of water with it.  Follow up in 2 weeks

## 2018-09-17 ENCOUNTER — Telehealth: Payer: Self-pay | Admitting: *Deleted

## 2018-09-17 NOTE — Telephone Encounter (Signed)
PA has been suspended for Symbicort 160 and is currently pending through [facility].

## 2018-09-18 MED ORDER — ALBUTEROL SULFATE (2.5 MG/3ML) 0.083% IN NEBU: 3.0000 mL | INHALATION_SOLUTION | RESPIRATORY_TRACT | 1 refills | Status: DC | PRN

## 2018-09-18 NOTE — Addendum Note (Signed)
Addended by: Orlene Erm on: 09/18/2018 04:50 PM   Modules accepted: Orders

## 2018-09-18 NOTE — Telephone Encounter (Signed)
Resent refill for albuterol with diagnosis code

## 2018-10-08 ENCOUNTER — Telehealth: Payer: Self-pay | Admitting: Allergy

## 2018-10-08 NOTE — Telephone Encounter (Signed)
Called patient and received Medicare Part D insurance information and confirmed Medicaid information. Will work on [employer] for medications needed. Mom is going to double check with pharmacy to ensure that they are running insurance correctly for her medications.

## 2018-10-08 NOTE — Telephone Encounter (Signed)
Patient could not get nebulizer treatment Pharmacy states that it now needs a prior approval Mom says she has been on this medication since she was a year old, never had this issue before mom states that child was supposed to have a return visit but hasnt finished her treatment yet due to not being able to get nebulizer solution??  Please call mom to answer any questions

## 2018-10-09 ENCOUNTER — Other Ambulatory Visit: Payer: Self-pay | Admitting: *Deleted

## 2018-10-09 MED ORDER — ALBUTEROL SULFATE (2.5 MG/3ML) 0.083% IN NEBU: 3.0000 mL | INHALATION_SOLUTION | RESPIRATORY_TRACT | 1 refills | Status: DC | PRN

## 2018-10-09 NOTE — Telephone Encounter (Signed)
PA has been completed for Symbicort and Pepcid and have been approved. Approval forms have been faxed to the patient's pharmacy and placed in bulk scanning. A PA for Flovent did not need to be done since it is covered by the patient's insurance. Prescription for Albuterol nebulizer solution has been sent to the pharmacy again with the diagnosis code attached and instructions for the pharmacy to file with Medicare part B for coverage. Attempted to all mom, no answer and mailbox is full. Will attempt to call shortly.

## 2018-10-09 NOTE — Telephone Encounter (Signed)
Attempted another phone call without success. Will need to try again.

## 2018-10-09 NOTE — Telephone Encounter (Signed)
For future reference Medicare Part D information: RxBIN: [id number] ID: [id number] Issuer #: [phone]) [id number] RxGroup: [id number] PCN: [id number]

## 2018-10-10 NOTE — Telephone Encounter (Signed)
Called and spoke with mom about medications being approved. Advised to mom that albuterol nebulizer needed to be filed with Medicare Part B and that the prescription has been sent again stating as such. Advised mom to call back with any issues from the pharmacy, but that everything should be taken care of now.

## 2018-10-15 ENCOUNTER — Ambulatory Visit: Payer: Medicare Other | Admitting: Allergy and Immunology

## 2018-10-29 ENCOUNTER — Ambulatory Visit (INDEPENDENT_AMBULATORY_CARE_PROVIDER_SITE_OTHER): Payer: Medicare Other | Admitting: Allergy and Immunology

## 2018-10-29 ENCOUNTER — Other Ambulatory Visit: Payer: Self-pay

## 2018-10-29 ENCOUNTER — Encounter: Payer: Self-pay | Admitting: Allergy and Immunology

## 2018-10-29 VITALS — BP 128/80 | HR 96 | Resp 16 | Ht 61.0 in | Wt 213.0 lb

## 2018-10-29 DIAGNOSIS — K219 Gastro-esophageal reflux disease without esophagitis: Secondary | ICD-10-CM | POA: Diagnosis not present

## 2018-10-29 DIAGNOSIS — J3089 Other allergic rhinitis: Secondary | ICD-10-CM

## 2018-10-29 DIAGNOSIS — J4540 Moderate persistent asthma, uncomplicated: Secondary | ICD-10-CM | POA: Diagnosis not present

## 2018-10-29 MED ORDER — FLUTICASONE PROPIONATE HFA 110 MCG/ACT IN AERO: 2.0000 | INHALATION_SPRAY | Freq: Two times a day (BID) | RESPIRATORY_TRACT | 5 refills | Status: DC

## 2018-10-29 NOTE — Patient Instructions (Addendum)
  1. Continue to Treat inflammation:   A. Symbicort 160 - 2 inhalations twice a day with spacer  B. nasal fluticasone one spray each nostril one time per day  C. montelukast 10 mg tablet 1 time per day  2. Continue to Treat reflux:   A. omeprazole 40 mg twice a day  B. Famotidine 40 mg once a day in evening  C. no caffeine or chocolate consumption  3. If needed:   A. ProAir HFA or albuterol nebulization  B. cetirizine 10 mg tablet 1 time per day  C.  OTC Mucinex DM  D.  OTC nasal saline  4.  "Action plan" for flareup:   A.  Continue Symbicort twice a day  B.  Add Flovent 110 -2 inhalations twice a day  5. Return to clinic in 3 months or earlier if there is a problem

## 2018-10-29 NOTE — Progress Notes (Signed)
[place]   Follow-up Note  Referring Provider: Elinor Parkinson Primary Provider: Elinor Parkinson Date of Office Visit: 10/29/2018  Subjective:   Mia Meyer (DOB: [birth year]) is a 23 y.o. female who returns to the [facility] and [place] on 10/29/2018 in re-evaluation of the following:  HPI: [name] returns to this clinic in evaluation of asthma and allergic rhinitis and LPR.  I have not seen her in this clinic since 20 November 2017 but she did visit with Dr. Maudie Mercury on 16 September 2018 with a flareup of coughing.  Apparently with therapy administered by Dr. Maudie Mercury in February she resolved her coughing but unfortunately over the course of the past 2 to 3 days she has once again developed persistent coughing with mucus production that oscillates from clear to green associated with gagging and retching.  She has not had any fever and no associated upper airway symptoms and no problems with her throat and she does believe that her reflux is under very good control at this point in time.  This has occurred even though she has been using her Symbicort and montelukast on a regular basis and continues to aggressively treat her reflux.  Allergies as of 10/29/2018      Reactions   Molds & Smuts Swelling   Cough.    Other    Seasonal Allergies       Medication List      acetaminophen 500 MG tablet Commonly known as:  TYLENOL Take by mouth.   amitriptyline 25 MG tablet Commonly known as:  ELAVIL TAKE 1 TABLET NIGHTLY. MAY START WITH 1/2 TABLET FOR A FEW DAYS   azelastine 0.1 % nasal spray Commonly known as:  ASTELIN Place 1-2 sprays into both nostrils 2 (two) times daily. For drainage.   beclomethasone 40 MCG/ACT inhaler Commonly known as:  Qvar Inhale 2 puffs into the lungs 2 (two) times daily.   budesonide-formoterol 160-4.5 MCG/ACT inhaler Commonly known as:  Symbicort Inhale 2 puffs into the lungs 2 (two) times daily.  cetirizine 10 MG tablet Commonly known as:  ZYRTEC Take by mouth.   cyclobenzaprine 5 MG tablet Commonly known as:  FLEXERIL Take 5 mg by mouth 2 (two) times daily. As directed.   diazepam 10 MG Gel Commonly known as:  DIASTAT ACUDIAL   famotidine 40 MG tablet Commonly known as:  Pepcid Take 1 tablet (40 mg total) by mouth daily.   fluticasone 110 MCG/ACT inhaler Commonly known as:  Flovent HFA Inhale 2 puffs into the lungs 2 (two) times daily.   fluticasone 50 MCG/ACT nasal spray Commonly known as:  FLONASE Place 1-2 sprays into both nostrils daily.   GuanFACINE HCl 3 MG Tb24 Take 3 mg by mouth daily.   ibuprofen 600 MG tablet Commonly known as:  ADVIL,MOTRIN Take 600 mg by mouth every 6 (six) hours as needed for pain.   levETIRAcetam 750 MG tablet Commonly known as:  KEPPRA   montelukast 10 MG tablet Commonly known as:  SINGULAIR Take 1 tablet (10 mg total) by mouth at bedtime.   norethindrone-ethinyl estradiol 1-20 MG-MCG tablet Commonly known as:  JUNEL FE,GILDESS FE,LOESTRIN FE Take by mouth.   omeprazole 40 MG capsule Commonly known as:  PRILOSEC TAKE 1 CAPSULE(40 MG) BY MOUTH TWICE DAILY   ondansetron 4 MG disintegrating tablet Commonly known as:  ZOFRAN-ODT   polyethylene glycol powder powder Commonly known as:  GLYCOLAX/MIRALAX 1/2 TO 1 CAPFUL IN  8OZ BEVERAGE DAILY AS NEEDED   ProAir HFA 108 (90 Base) MCG/ACT inhaler Generic drug:  albuterol Inhale two puffs every 4-6 hours if needed for cough or wheeze   albuterol (2.5 MG/3ML) 0.083% nebulizer solution Commonly known as:  PROVENTIL Inhale 3 mLs into the lungs every 4 (four) hours as needed for wheezing (cough.). Dx J45.40   ranitidine 300 MG tablet Commonly known as:  ZANTAC Take 1 tablet (300 mg total) by mouth at bedtime.   Setlakin 0.15-0.03 MG tablet Generic drug:  levonorgestrel-ethinyl estradiol Take 1 tablet by mouth daily.   SUMAtriptan 100 MG tablet Commonly known as:  IMITREX  Take 1 tablet at onset of migraine. Can repeat once in 2 hr if needed. No more than 2 tabs in 24 hr.   triamcinolone cream 0.1 % Commonly known as:  KENALOG Apply 1 application topically 2 (two) times daily.       Past Medical History:  Diagnosis Date  . Allergy    mold allergy  . Asthma   . Cerebral palsy ([place])   . Common migraine 11/27/2017  . GERD (gastroesophageal reflux disease)   . Neonatal stroke [facility])     Past Surgical History:  Procedure Laterality Date  . SHUNT REPLACEMENT Left    Shunt replaced in 2002  . TENDON RELEASE Right     Review of systems negative except as noted in HPI / PMHx or noted below:  Review of Systems  Constitutional: Negative.   HENT: Negative.   Eyes: Negative.   Respiratory: Negative.   Cardiovascular: Negative.   Gastrointestinal: Negative.   Genitourinary: Negative.   Musculoskeletal: Negative.   Skin: Negative.   Neurological: Negative.   Endo/Heme/Allergies: Negative.   Psychiatric/Behavioral: Negative.      Objective:   Vitals:   10/29/18 1718  BP: 128/80  Pulse: 96  Resp: 16  SpO2: 98%   Height: 5\' 1"  (154.9 cm)  Weight: 213 lb (96.6 kg)   Physical Exam Constitutional:      Appearance: She is not diaphoretic.  HENT:     Head: Normocephalic.     Right Ear: Tympanic membrane, ear canal and external ear normal.     Left Ear: Tympanic membrane, ear canal and external ear normal.     Nose: Nose normal. No mucosal edema or rhinorrhea.     Mouth/Throat:     Pharynx: Uvula midline. No oropharyngeal exudate.  Eyes:     Conjunctiva/sclera: Conjunctivae normal.  Neck:     Thyroid: No thyromegaly.     Trachea: Trachea normal. No tracheal tenderness or tracheal deviation.  Cardiovascular:     Rate and Rhythm: Normal rate and regular rhythm.     Heart sounds: Normal heart sounds, S1 normal and S2 normal. No murmur.  Pulmonary:     Effort: No respiratory distress.     Breath sounds: Normal breath sounds. No stridor.  No wheezing or rales.  Lymphadenopathy:     Head:     Right side of head: No tonsillar adenopathy.     Left side of head: No tonsillar adenopathy.     Cervical: No cervical adenopathy.  Skin:    Findings: No erythema or rash.     Nails: There is no clubbing.   Neurological:     Mental Status: She is alert.     Diagnostics:    Spirometry was performed and demonstrated an FEV1 of 1.74 at 66 % of predicted.  Results of a chest x-ray obtained 23 August 2018 identified  the following:  Low lung volumes. Basilar opacities likely vascular crowding. Stable cardiomediastinal contours. VP shunt catheter tubing again noted overlying the lower neck and left chest.  Assessment and Plan:   1. Not well controlled moderate persistent asthma   2. Other allergic rhinitis   3. LPRD (laryngopharyngeal reflux disease)     1. Continue to Treat inflammation:   A. Symbicort 160 - 2 inhalations twice a day with spacer  B. nasal fluticasone one spray each nostril one time per day  C. montelukast 10 mg tablet 1 time per day  2. Continue to Treat reflux:   A. omeprazole 40 mg twice a day  B. Famotidine 40 mg once a day in evening  C. no caffeine or chocolate consumption  3. If needed:   A. ProAir HFA or albuterol nebulization  B. cetirizine 10 mg tablet 1 time per day  C.  OTC Mucinex DM  D.  OTC nasal saline  4.  "Action plan" for flareup:   A.  Continue Symbicort twice a day  B.  Add Flovent 110 -2 inhalations twice a day  5. Return to clinic in 3 months or earlier if there is a problem  It appears that [name] has a respiratory track infection that is giving rise to inflammation of her airway and I will assume that this will resolve over the course the next several days given the fact that it appears to be viral in nature.  We will support her with the therapy noted above which includes anti-inflammatory agents for airway and a "action plan" during this flareup.  She will remain on  aggressive therapy directed against reflux as reflux is 1 of her major issues giving rise to her respiratory tract symptoms.  If she does well I will see her back in this clinic in 3 months or earlier if there is a problem.  Allena Katz, MD Allergy / Immunology [place]

## 2018-10-29 NOTE — Telephone Encounter (Signed)
Patient came into the office C/O medication not being distributed to them due to unknown issue with pharmacy. Contacted Pharmacy and patient just had to proof they had Medicare Part B and they would have received their Rx.

## 2018-10-30 ENCOUNTER — Encounter: Payer: Self-pay | Admitting: Allergy and Immunology

## 2018-10-31 ENCOUNTER — Other Ambulatory Visit: Payer: Self-pay | Admitting: *Deleted

## 2018-11-04 ENCOUNTER — Other Ambulatory Visit: Payer: Self-pay | Admitting: Allergy and Immunology

## 2018-11-04 MED ORDER — FULL KIT NEBULIZER SET MISC: 1.0000 | 0 refills | Status: DC

## 2018-11-04 NOTE — Telephone Encounter (Signed)
Nebulizer machine prescription has been sent with the Part D information. I called to inform mom but the mailbox was full.

## 2018-11-04 NOTE — Telephone Encounter (Signed)
Mom called requesting a new nebulizer machine. [facility].

## 2018-11-27 ENCOUNTER — Other Ambulatory Visit: Payer: Self-pay

## 2018-11-27 ENCOUNTER — Telehealth: Payer: Self-pay | Admitting: *Deleted

## 2018-11-27 NOTE — Telephone Encounter (Signed)
PA has been approved and faxed to pharmacy. PA approval form has been labeled and placed in bulk scanning.

## 2018-11-27 NOTE — Telephone Encounter (Signed)
A PA has been submitted via CoverMyMeds for QVAR 40. Currently waiting approval or denial.

## 2018-11-27 NOTE — Telephone Encounter (Signed)
Qvar has been approved: 08/14/2019

## 2019-01-20 ENCOUNTER — Telehealth: Payer: Self-pay | Admitting: *Deleted

## 2019-01-20 NOTE — Telephone Encounter (Signed)
Received a call from Mia Meyer at [place] wanting clarification on what medication should patient be on. Advised last ov 10/29/2018 Symbicort 160 is her maintance medication and Flovent 110 to be added for asthma flares. [name] verbalized understanding will d/c order for Qvar in patient's profile

## 2019-01-29 ENCOUNTER — Telehealth: Payer: Self-pay | Admitting: *Deleted

## 2019-01-29 NOTE — Telephone Encounter (Signed)
Received PA for albuterol 0.083% but do not have insurance on file. Mia Meyer called patient's mother to bring in card to make copy and be able to process PA

## 2019-06-13 DIAGNOSIS — R519 Headache, unspecified: Secondary | ICD-10-CM | POA: Insufficient documentation

## 2019-06-13 DIAGNOSIS — H47293 Other optic atrophy, bilateral: Secondary | ICD-10-CM | POA: Insufficient documentation

## 2019-06-15 ENCOUNTER — Other Ambulatory Visit: Payer: Self-pay | Admitting: Allergy

## 2019-06-15 ENCOUNTER — Other Ambulatory Visit: Payer: Self-pay | Admitting: Allergy and Immunology

## 2019-07-05 ENCOUNTER — Other Ambulatory Visit: Payer: Self-pay | Admitting: Allergy

## 2019-07-21 ENCOUNTER — Telehealth: Payer: Self-pay

## 2019-07-21 NOTE — Telephone Encounter (Signed)
Received fax for refill for Albuterol neb solution. Patient was last seen 10/29/2018. Patient will need an OV for refills.

## 2019-09-15 ENCOUNTER — Other Ambulatory Visit: Payer: Self-pay | Admitting: *Deleted

## 2019-09-15 MED ORDER — ALBUTEROL SULFATE (2.5 MG/3ML) 0.083% IN NEBU: 3.0000 mL | INHALATION_SOLUTION | RESPIRATORY_TRACT | 0 refills | Status: DC | PRN

## 2019-09-15 MED ORDER — FAMOTIDINE 40 MG PO TABS: 40.0000 mg | ORAL_TABLET | Freq: Every day | ORAL | 0 refills | Status: DC

## 2019-09-18 ENCOUNTER — Other Ambulatory Visit: Payer: Self-pay | Admitting: *Deleted

## 2019-09-30 ENCOUNTER — Telehealth: Payer: Self-pay | Admitting: Allergy and Immunology

## 2019-09-30 MED ORDER — OMEPRAZOLE 40 MG PO CPDR: DELAYED_RELEASE_CAPSULE | ORAL | 0 refills | Status: DC

## 2019-09-30 MED ORDER — CETIRIZINE HCL 10 MG PO TABS: 10.0000 mg | ORAL_TABLET | Freq: Every day | ORAL | 0 refills | Status: DC

## 2019-09-30 MED ORDER — AZELASTINE HCL 0.1 % NA SOLN: NASAL | 0 refills | Status: DC

## 2019-09-30 NOTE — Telephone Encounter (Signed)
Patients mother has scheduled an appointment for 3/23 however patient is out of her medication albuterol nebulizer, inhaler, cetirizine, azelastine and her acid reflux medicine.  She is wanting to know if a courtesy refill can be out in while she is awaiting her appointment.  Please advise.

## 2019-09-30 NOTE — Telephone Encounter (Signed)
Courtesy refill sent in for ones that already been sent in.

## 2019-10-20 ENCOUNTER — Other Ambulatory Visit: Payer: Self-pay | Admitting: Allergy

## 2019-11-04 ENCOUNTER — Other Ambulatory Visit: Payer: Self-pay

## 2019-11-04 ENCOUNTER — Ambulatory Visit (INDEPENDENT_AMBULATORY_CARE_PROVIDER_SITE_OTHER): Payer: Medicare HMO | Admitting: Allergy and Immunology

## 2019-11-04 ENCOUNTER — Encounter: Payer: Self-pay | Admitting: Allergy and Immunology

## 2019-11-04 VITALS — BP 110/78 | HR 100 | Temp 97.4°F | Resp 18

## 2019-11-04 DIAGNOSIS — J4540 Moderate persistent asthma, uncomplicated: Secondary | ICD-10-CM | POA: Diagnosis not present

## 2019-11-04 DIAGNOSIS — J3089 Other allergic rhinitis: Secondary | ICD-10-CM | POA: Diagnosis not present

## 2019-11-04 DIAGNOSIS — K219 Gastro-esophageal reflux disease without esophagitis: Secondary | ICD-10-CM

## 2019-11-04 MED ORDER — CETIRIZINE HCL 10 MG PO TABS: 10.0000 mg | ORAL_TABLET | Freq: Every day | ORAL | 5 refills | Status: DC

## 2019-11-04 MED ORDER — FLUTICASONE PROPIONATE 50 MCG/ACT NA SUSP: 1.0000 | Freq: Every day | NASAL | 5 refills | Status: DC

## 2019-11-04 MED ORDER — PROAIR HFA 108 (90 BASE) MCG/ACT IN AERS: INHALATION_SPRAY | RESPIRATORY_TRACT | 1 refills | Status: DC

## 2019-11-04 MED ORDER — ALBUTEROL SULFATE (2.5 MG/3ML) 0.083% IN NEBU: 3.0000 mL | INHALATION_SOLUTION | RESPIRATORY_TRACT | 0 refills | Status: DC | PRN

## 2019-11-04 MED ORDER — MONTELUKAST SODIUM 10 MG PO TABS: 10.0000 mg | ORAL_TABLET | Freq: Every day | ORAL | 5 refills | Status: DC

## 2019-11-04 MED ORDER — FAMOTIDINE 40 MG PO TABS: 40.0000 mg | ORAL_TABLET | Freq: Every day | ORAL | 5 refills | Status: DC

## 2019-11-04 MED ORDER — OMEPRAZOLE 40 MG PO CPDR: DELAYED_RELEASE_CAPSULE | ORAL | 5 refills | Status: DC

## 2019-11-04 MED ORDER — BUDESONIDE-FORMOTEROL FUMARATE 160-4.5 MCG/ACT IN AERO: 2.0000 | INHALATION_SPRAY | Freq: Two times a day (BID) | RESPIRATORY_TRACT | 3 refills | Status: DC

## 2019-11-04 NOTE — Patient Instructions (Addendum)
  1. Continue to Treat inflammation:   A. Symbicort 160 - 2 inhalations twice a day with spacer  B. nasal fluticasone one spray each nostril one time per day  C. montelukast 10 mg tablet 1 time per day  2. Continue to Treat reflux:   A. omeprazole 40 mg twice a day  B. Famotidine 40 mg once a day in evening  C. no caffeine or chocolate consumption  3. If needed:   A. ProAir HFA or albuterol nebulization  B. cetirizine 10 mg tablet 1 time per day  C.  OTC Mucinex DM  D.  OTC nasal saline  4.  "Action plan" for flareup:   A.  Continue Symbicort twice a day  B.  Add Flovent 110 -2 inhalations twice a day  5. Return to clinic in 6 months or earlier if there is a problem

## 2019-11-04 NOTE — Progress Notes (Signed)
[place]   Follow-up Note  Referring Provider: Elinor Parkinson Primary Provider: Elinor Parkinson Date of Office Visit: 11/04/2019  Subjective:   Mia Meyer (DOB: [birth year]) is a 24 y.o. female who returns to the All[facility] and [place] on 11/04/2019 in re-evaluation of the following:  HPI: Mia Meyer returns to this clinic in reevaluation of asthma and allergic rhinitis and LPR.  Her last visit to this clinic with me was 29 October 2018.  She has really done well with her asthma.  She has not had the need for a systemic steroid to treat an exacerbation and rarely uses a short acting bronchodilator while she continues to use Symbicort and montelukast on a consistent basis.  Likewise her nose has really been doing quite well while consistently using Flonase.  She has not required an antibiotic to treat an episode of sinusitis.  Her reflux is under excellent control as long she continues to use a proton pump inhibitor twice a day and an H2 receptor blocker.  She remains away from caffeine and chocolate consumption.  She did receive the flu vaccine and has received her first [employer] Covid vaccine.  Allergies as of 11/04/2019      Reactions   Molds & Smuts Swelling   Cough.    Other    Seasonal Allergies       Medication List      acetaminophen 500 MG tablet Commonly known as: TYLENOL Take by mouth.   amitriptyline 25 MG tablet Commonly known as: ELAVIL TAKE 1 TABLET NIGHTLY. MAY START WITH 1/2 TABLET FOR A FEW DAYS   azelastine 0.1 % nasal spray Commonly known as: ASTELIN PLACE 1 TO 2 SPRAYS IN EACH NOSTRIL TWICE DAILY FOR DRAINAGE   beclomethasone 40 MCG/ACT inhaler Commonly known as: Qvar Inhale 2 puffs into the lungs 2 (two) times daily.   budesonide-formoterol 160-4.5 MCG/ACT inhaler Commonly known as: Symbicort Inhale 2 puffs into the lungs 2 (two) times daily.   cetirizine 10 MG tablet Commonly  known as: ZYRTEC Take 1 tablet (10 mg total) by mouth daily.   cyclobenzaprine 5 MG tablet Commonly known as: FLEXERIL Take 5 mg by mouth 2 (two) times daily. As directed.   diazepam 10 MG Gel Commonly known as: DIASTAT ACUDIAL   famotidine 40 MG tablet Commonly known as: Pepcid Take 1 tablet (40 mg total) by mouth daily.   Flovent HFA 110 MCG/ACT inhaler Generic drug: fluticasone INHALE 2 PUFFS INTO THE LUNGS TWICE DAILY   fluticasone 50 MCG/ACT nasal spray Commonly known as: FLONASE Place 1-2 sprays into both nostrils daily.   Full Kit Nebulizer Set Misc 1 kit by Does not apply route See admin instructions.   GuanFACINE HCl 3 MG Tb24 Take 3 mg by mouth daily.   ibuprofen 600 MG tablet Commonly known as: ADVIL Take 600 mg by mouth every 6 (six) hours as needed for pain.   levETIRAcetam 750 MG tablet Commonly known as: KEPPRA   montelukast 10 MG tablet Commonly known as: SINGULAIR Take 1 tablet (10 mg total) by mouth at bedtime.   norethindrone-ethinyl estradiol 1-20 MG-MCG tablet Commonly known as: LOESTRIN FE Take by mouth.   omeprazole 40 MG capsule Commonly known as: PRILOSEC TAKE 1 CAPSULE(40 MG) BY MOUTH TWICE DAILY   ondansetron 4 MG disintegrating tablet Commonly known as: ZOFRAN-ODT   polyethylene glycol powder 17 GM/SCOOP powder Commonly known as: GLYCOLAX/MIRALAX 1/2 TO 1 CAPFUL IN 8OZ  BEVERAGE DAILY AS NEEDED   ProAir HFA 108 (90 Base) MCG/ACT inhaler Generic drug: albuterol Inhale two puffs every 4-6 hours if needed for cough or wheeze   albuterol (2.5 MG/3ML) 0.083% nebulizer solution Commonly known as: PROVENTIL Inhale 3 mLs into the lungs every 4 (four) hours as needed for wheezing (cough.).   ranitidine 300 MG tablet Commonly known as: ZANTAC Take 1 tablet (300 mg total) by mouth at bedtime.   Setlakin 0.15-0.03 MG tablet Generic drug: levonorgestrel-ethinyl estradiol Take 1 tablet by mouth daily.   SUMAtriptan 100 MG  tablet Commonly known as: IMITREX Take 1 tablet at onset of migraine. Can repeat once in 2 hr if needed. No more than 2 tabs in 24 hr.   triamcinolone cream 0.1 % Commonly known as: KENALOG Apply 1 application topically 2 (two) times daily.       Past Medical History:  Diagnosis Date  . Allergy    mold allergy  . Asthma   . Cerebral palsy ([place])   . Common migraine 11/27/2017  . GERD (gastroesophageal reflux disease)   . Neonatal stroke     Past Surgical History:  Procedure Laterality Date  . SHUNT REPLACEMENT Left    Shunt replaced in 2002  . TENDON RELEASE Right     Review of systems negative except as noted in HPI / PMHx or noted below:  Review of Systems  Constitutional: Negative.   HENT: Negative.   Eyes: Negative.   Respiratory: Negative.   Cardiovascular: Negative.   Gastrointestinal: Negative.   Genitourinary: Negative.   Musculoskeletal: Negative.   Skin: Negative.   Neurological: Negative.   Endo/Heme/Allergies: Negative.   Psychiatric/Behavioral: Negative.      Objective:   Vitals:   11/04/19 1416  BP: 110/78  Pulse: 100  Resp: 18  Temp: (!) 97.4 F (36.3 C)  SpO2: 98%          Physical Exam Constitutional:      Appearance: She is not diaphoretic.  HENT:     Head: Normocephalic.     Right Ear: Tympanic membrane, ear canal and external ear normal.     Left Ear: Tympanic membrane, ear canal and external ear normal.     Nose: Nose normal. No mucosal edema or rhinorrhea.     Mouth/Throat:     Pharynx: Uvula midline. No oropharyngeal exudate.  Eyes:     Conjunctiva/sclera: Conjunctivae normal.  Neck:     Thyroid: No thyromegaly.     Trachea: Trachea normal. No tracheal tenderness or tracheal deviation.  Cardiovascular:     Rate and Rhythm: Normal rate and regular rhythm.     Heart sounds: Normal heart sounds, S1 normal and S2 normal. No murmur.  Pulmonary:     Effort: No respiratory distress.     Breath sounds: Normal breath sounds.  No stridor. No wheezing or rales.  Lymphadenopathy:     Head:     Right side of head: No tonsillar adenopathy.     Left side of head: No tonsillar adenopathy.     Cervical: No cervical adenopathy.  Skin:    Findings: No erythema or rash.     Nails: There is no clubbing.  Neurological:     Mental Status: She is alert.     Diagnostics:    Spirometry was performed and demonstrated an FEV1 of 2.24 at 85 % of predicted.  The patient had an Asthma Control Test with the following results: ACT Total Score: 24.    Assessment and  Plan:   1. Asthma, moderate persistent, well-controlled   2. Other allergic rhinitis   3. LPRD (laryngopharyngeal reflux disease)     1. Continue to Treat inflammation:   A. Symbicort 160 - 2 inhalations twice a day with spacer  B. nasal fluticasone one spray each nostril one time per day  C. montelukast 10 mg tablet 1 time per day  2. Continue to Treat reflux:   A. omeprazole 40 mg twice a day  B. Famotidine 40 mg once a day in evening  C. no caffeine or chocolate consumption  3. If needed:   A. ProAir HFA or albuterol nebulization  B. cetirizine 10 mg tablet 1 time per day  C.  OTC Mucinex DM  D.  OTC nasal saline  4.  "Action plan" for flareup:   A.  Continue Symbicort twice a day  B.  Add Flovent 110 -2 inhalations twice a day  5. Return to clinic in 6 months or earlier if there is a problem  Alania has really done very well with both her eosinophilic driven airway issue and her reflux induced respiratory tract issue on her current medical therapy which includes anti-inflammatory agents for her airway and a proton pump inhibitor and H2 receptor blocker for reflux.  She will continue to utilize this plan and I will see her back in this clinic in 6 months or earlier if there is a problem.   Allena Katz, MD Allergy / Immunology [place]

## 2019-11-05 ENCOUNTER — Encounter: Payer: Self-pay | Admitting: Allergy and Immunology

## 2020-01-15 ENCOUNTER — Other Ambulatory Visit: Payer: Self-pay | Admitting: Allergy and Immunology

## 2020-03-13 ENCOUNTER — Other Ambulatory Visit: Payer: Self-pay | Admitting: Allergy and Immunology

## 2020-06-22 ENCOUNTER — Other Ambulatory Visit: Payer: Self-pay | Admitting: Allergy and Immunology

## 2020-06-22 ENCOUNTER — Telehealth: Payer: Self-pay

## 2020-06-22 NOTE — Telephone Encounter (Signed)
Spoke to mom and informed her she needed a 6 month follow-up visit and I can give her a courtesy refill on the medications requested.

## 2020-07-21 DIAGNOSIS — T85618A Breakdown (mechanical) of other specified internal prosthetic devices, implants and grafts, initial encounter: Secondary | ICD-10-CM | POA: Insufficient documentation

## 2020-07-27 ENCOUNTER — Ambulatory Visit: Payer: Self-pay | Admitting: Allergy and Immunology

## 2020-08-07 ENCOUNTER — Other Ambulatory Visit: Payer: Self-pay | Admitting: Allergy and Immunology

## 2020-10-04 ENCOUNTER — Other Ambulatory Visit: Payer: Self-pay | Admitting: Allergy and Immunology

## 2020-10-05 ENCOUNTER — Other Ambulatory Visit: Payer: Self-pay | Admitting: Allergy and Immunology

## 2020-11-11 ENCOUNTER — Other Ambulatory Visit: Payer: Self-pay | Admitting: Allergy and Immunology

## 2020-11-12 NOTE — Telephone Encounter (Signed)
Patient requesting refills.  Patient has not been seen since March 2021 and were schedule to come back in 6 months.  Spoke with patient's mom.  Per mom she will call back after 1 pm to schedule an appointment.

## 2020-12-14 ENCOUNTER — Other Ambulatory Visit: Payer: Self-pay

## 2020-12-14 ENCOUNTER — Encounter: Payer: Self-pay | Admitting: Allergy and Immunology

## 2020-12-14 ENCOUNTER — Ambulatory Visit (INDEPENDENT_AMBULATORY_CARE_PROVIDER_SITE_OTHER): Payer: Medicare HMO | Admitting: Allergy and Immunology

## 2020-12-14 VITALS — BP 98/78 | HR 93 | Temp 97.4°F | Resp 16 | Ht 61.0 in | Wt 225.0 lb

## 2020-12-14 DIAGNOSIS — J3089 Other allergic rhinitis: Secondary | ICD-10-CM

## 2020-12-14 DIAGNOSIS — K219 Gastro-esophageal reflux disease without esophagitis: Secondary | ICD-10-CM | POA: Diagnosis not present

## 2020-12-14 DIAGNOSIS — J4540 Moderate persistent asthma, uncomplicated: Secondary | ICD-10-CM

## 2020-12-14 MED ORDER — VENTOLIN HFA 108 (90 BASE) MCG/ACT IN AERS: INHALATION_SPRAY | RESPIRATORY_TRACT | 3 refills | Status: DC

## 2020-12-14 MED ORDER — FAMOTIDINE 40 MG PO TABS: 40.0000 mg | ORAL_TABLET | Freq: Every day | ORAL | 5 refills | Status: DC

## 2020-12-14 MED ORDER — MONTELUKAST SODIUM 10 MG PO TABS: 10.0000 mg | ORAL_TABLET | Freq: Every day | ORAL | 5 refills | Status: DC

## 2020-12-14 MED ORDER — FLUTICASONE PROPIONATE 50 MCG/ACT NA SUSP: 1.0000 | Freq: Every day | NASAL | 5 refills | Status: DC

## 2020-12-14 MED ORDER — BUDESONIDE-FORMOTEROL FUMARATE 160-4.5 MCG/ACT IN AERO
2.0000 | INHALATION_SPRAY | Freq: Two times a day (BID) | RESPIRATORY_TRACT | 5 refills | Status: DC
Start: 2020-12-14 — End: 2021-06-21

## 2020-12-14 MED ORDER — ALBUTEROL SULFATE (2.5 MG/3ML) 0.083% IN NEBU: INHALATION_SOLUTION | RESPIRATORY_TRACT | 5 refills | Status: DC

## 2020-12-14 MED ORDER — OMEPRAZOLE 40 MG PO CPDR: DELAYED_RELEASE_CAPSULE | ORAL | 5 refills | Status: DC

## 2020-12-14 MED ORDER — FLOVENT HFA 110 MCG/ACT IN AERO: 2.0000 | INHALATION_SPRAY | Freq: Two times a day (BID) | RESPIRATORY_TRACT | 3 refills | Status: DC

## 2020-12-14 MED ORDER — AZELASTINE HCL 0.1 % NA SOLN: NASAL | 5 refills | Status: AC

## 2020-12-14 MED ORDER — CETIRIZINE HCL 10 MG PO TABS: 10.0000 mg | ORAL_TABLET | Freq: Every day | ORAL | 5 refills | Status: DC

## 2020-12-14 NOTE — Patient Instructions (Addendum)
  1. Continue to Treat inflammation:   A. Symbicort 160 - 2 inhalations twice a day with spacer  B. nasal fluticasone one spray each nostril one time per day  C. montelukast 10 mg tablet 1 time per day  2. Continue to Treat reflux:   A. omeprazole 40 mg twice a day  B. Famotidine 40 mg once a day in evening  C. no caffeine or chocolate consumption  3. If needed:   A. ProAir HFA or albuterol nebulization  B. cetirizine 10 mg tablet 1 time per day  C.  OTC Mucinex DM  D.  OTC nasal saline  4.  "Action plan" for flareup:   A.  Continue Symbicort twice a day  B.  Add Flovent 110 -2 inhalations twice a day  5. Return to clinic in 6 months or earlier if there is a problem  6.  Obtain COVID booster

## 2020-12-14 NOTE — Progress Notes (Signed)
[place] - [place] - [place] - [place] - [place]   Follow-up Note  Referring Provider: Leonard Downing Primary Provider: Barrington Ellison Date of Office Visit: 12/14/2020  Subjective:   Mia Meyer (DOB: October 08, 1995) is a 25 y.o. female who [facility] in re-evaluation of the following:  HPI: Mia Meyer returns to this clinic in reevaluation of asthma and allergic rhinitis and LPR.  I have not seen her in this clinic since 04 November 2019.  She has really done well since her last visit and has not required a systemic steroid or antibiotic to treat any type of airway issue.  She rarely uses a short acting bronchodilator.  She does not really exercise to any extent.  She does not have coughing or shortness of breath or chest tightness and she does not have any sneezing and nasal congestion and anosmia.  Her reflux has been under good control on her current plan and she has not had any significant throat problems.  She has developed snoring and there apparently is choking when she sleeps although this never wakes her up at nighttime and this pattern of sleep-related breathing is only observed by her mother.  She apparently had a sleep study 2 years ago.  She has received 2 Pfizer COVID vaccines to date.  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  Allergies as of 12/14/2020      Reactions   Molds & Smuts Swelling   Cough.    Other    Seasonal Allergies       Medication List      acetaminophen 500 MG tablet Commonly known as: TYLENOL Take by mouth.   albuterol (2.5 MG/3ML) 0.083% nebulizer solution Commonly known as: PROVENTIL USE 3 ML VIA NEBULIZER EVERY 4 HOURS AS NEEDED FOR WHEEZING OR COUGH   Ventolin HFA 108 (90 Base) MCG/ACT inhaler Generic drug: albuterol INHALE 2 PUFFS INTO THE LUNGS EVERY 4 TO  6 HOURS AS NEEDED FOR COUGH OR WHEEZING   amitriptyline 25 MG tablet Commonly known as: ELAVIL TAKE 1 TABLET NIGHTLY. MAY START WITH 1/2 TABLET FOR A FEW DAYS   azelastine 0.1 % nasal spray Commonly known as: ASTELIN PLACE 1 TO 2 SPRAYS IN EACH NOSTRIL TWICE DAILY FOR DRAINAGE   beclomethasone 40 MCG/ACT inhaler Commonly known as: Qvar Inhale 2 puffs into the lungs 2 (two) times daily.   cetirizine 10 MG tablet Commonly known as: ZYRTEC Take 1 tablet (10 mg total) by mouth daily.   cyclobenzaprine 5 MG tablet Commonly known as: FLEXERIL Take 5 mg by mouth 2 (two) times daily. As directed.   diazepam 10 MG Gel Commonly known as: DIASTAT ACUDIAL   famotidine 40 MG tablet Commonly known as: Pepcid Take 1 tablet (40 mg total) by mouth daily.   Flovent HFA 110 MCG/ACT inhaler Generic drug: fluticasone INHALE 2 PUFFS INTO THE LUNGS TWICE DAILY   fluticasone 50 MCG/ACT nasal spray Commonly known as: FLONASE Place 1 spray into both nostrils daily.   Full Kit Nebulizer Set Misc 1 kit by Does not apply route See admin instructions.   GuanFACINE HCl 3 MG Tb24 Take 3 mg by mouth daily.   ibuprofen 600 MG tablet Commonly known as: ADVIL Take 600 mg by mouth every 6 (six) hours as needed for pain.   levETIRAcetam 750 MG tablet Commonly known as:  KEPPRA   montelukast 10 MG tablet Commonly known as: SINGULAIR Take 1 tablet (10 mg total) by mouth at bedtime.   norethindrone-ethinyl estradiol 1-20 MG-MCG tablet Commonly known as: LOESTRIN FE Take by mouth.   omeprazole 40 MG capsule Commonly known as: PRILOSEC TAKE 1 CAPSULE(40 MG) BY MOUTH TWICE DAILY   ondansetron 4 MG disintegrating tablet Commonly known as: ZOFRAN-ODT   polyethylene glycol powder 17 GM/SCOOP powder Commonly known as: GLYCOLAX/MIRALAX 1/2 TO 1 CAPFUL IN 8OZ BEVERAGE DAILY AS NEEDED   ranitidine 300 MG tablet Commonly known as: ZANTAC Take 1 tablet (300 mg total) by mouth at bedtime.    Setlakin 0.15-0.03 MG tablet Generic drug: levonorgestrel-ethinyl estradiol Take 1 tablet by mouth daily.   SUMAtriptan 100 MG tablet Commonly known as: IMITREX Take 1 tablet at onset of migraine. Can repeat once in 2 hr if needed. No more than 2 tabs in 24 hr.   Symbicort 160-4.5 MCG/ACT inhaler Generic drug: budesonide-formoterol INHALE 2 PUFFS INTO THE LUNGS TWICE DAILY   triamcinolone cream 0.1 % Commonly known as: KENALOG Apply 1 application topically 2 (two) times daily.       Past Medical History:  Diagnosis Date  . Allergy    mold allergy  . Asthma   . Cerebral palsy ([place])   . Common migraine 11/27/2017  . GERD (gastroesophageal reflux disease)   . Neonatal stroke     Past Surgical History:  Procedure Laterality Date  . SHUNT REPLACEMENT Left    Shunt replaced in 2002  . TENDON RELEASE Right     Review of systems negative except as noted in HPI / PMHx or noted below:  Review of Systems  Constitutional: Negative.   HENT: Negative.   Eyes: Negative.   Respiratory: Negative.   Cardiovascular: Negative.   Gastrointestinal: Negative.   Genitourinary: Negative.   Musculoskeletal: Negative.   Skin: Negative.   Neurological: Negative.   Endo/Heme/Allergies: Negative.   Psychiatric/Behavioral: Negative.      Objective:   Vitals:   12/14/20 1437  BP: 98/78  Pulse: 93  Resp: 16  Temp: (!) 97.4 F (36.3 C)  SpO2: 98%   Height: $Remove'5\' 1"'HDKjGaw$  (154.9 cm)  Weight: 225 lb (102.1 kg)   Physical Exam Constitutional:      Appearance: She is not diaphoretic.  HENT:     Head: Normocephalic.     Right Ear: Tympanic membrane, ear canal and external ear normal.     Left Ear: Tympanic membrane, ear canal and external ear normal.     Nose: Nose normal. No mucosal edema or rhinorrhea.     Mouth/Throat:     Pharynx: Uvula midline. No oropharyngeal exudate.  Eyes:     Conjunctiva/sclera: Conjunctivae normal.  Neck:     Thyroid: No thyromegaly.     Trachea:  Trachea normal. No tracheal tenderness or tracheal deviation.  Cardiovascular:     Rate and Rhythm: Normal rate and regular rhythm.     Heart sounds: Normal heart sounds, S1 normal and S2 normal. No murmur heard.   Pulmonary:     Effort: No respiratory distress.     Breath sounds: Normal breath sounds. No stridor. No wheezing or rales.  Lymphadenopathy:     Head:     Right side of head: No tonsillar adenopathy.     Left side of head: No tonsillar adenopathy.     Cervical: No cervical adenopathy.  Skin:    Findings: No erythema or rash.     Nails: There  is no clubbing.  Neurological:     Mental Status: She is alert.     Diagnostics:    Spirometry was performed and demonstrated an FEV1 of 1.73 at 67 % of predicted.  Results of a sleep study obtained 14 June 2018 identified the following:  Lights Out was at 22:37 and Lights On at 04:53. Total recording  time (TRT) was 376 minutes, with a total sleep time (TST) of 354  minutes. The patient's sleep latency was 7.5 minutes. REM latency  was 124 minutes, which is mildly delayed. The sleep efficiency  was 94.1%.    SLEEP ARCHITECTURE: WASO (Wake after sleep onset) was 14 minutes  with no significant sleep fragmentation noted. There were 2.5  minutes in Stage N1, 175 minutes Stage N2, 126.5 minutes Stage N3  and 50 minutes in Stage REM. The percentage of Stage N1 was .7%,  Stage N2 was 49.4%, which is normal, Stage N3 was 35.7%, which is  increased, and Stage R (REM sleep) was 14.1%, which is reduced.  The arousals were noted as: 9 were spontaneous, 0 were associated  with PLMs, 1 were associated with respiratory events.   RESPIRATORY ANALYSIS: There were a total of 10 respiratory  events: 1 obstructive apneas, 5 central apneas and 0 mixed  apneas with a total of 6 apneas and an apnea index (AI) of 1.  /hour. There were 4 hypopneas with a hypopnea index of .7 /hour.  The patient also had 0 respiratory event related  arousals  (RERAs).    The total APNEA/HYPOPNEA INDEX (AHI) was 1.7/hour and the total  RESPIRATORY DISTURBANCE INDEX was 1.7 /hour. 3 events occurred  in REM sleep and 10 events in NREM. The REM AHI was 3.6 /hour,  versus a non-REM AHI of 1.4. The patient spent 211.5 minutes of  total sleep time in the supine position and 143 minutes in  non-supine.. The supine AHI was 1.4 versus a non-supine AHI of  2.1.   OXYGEN SATURATION & C02: The Wake baseline 02 saturation was  96%, with the lowest being 83%. Time spent below 89% saturation  equaled 9 minutes (likely overestimated, due to loss of sensor).   PERIODIC LIMB MOVEMENTS: The patient had a total of 0 Periodic  Limb Movements. The Periodic Limb Movement (PLM) index was 0 and  the PLM Arousal index was 0/hour.   Audio and video analysis did not show any abnormal or unusual  movements, behaviors, phonations or vocalizations. The patient  took no bathroom breaks. Mild to moderate snoring was noted. The  EKG was in keeping with normal sinus rhythm (NSR).   Assessment and Plan:   1. Asthma, moderate persistent, well-controlled   2. Other allergic rhinitis   3. LPRD (laryngopharyngeal reflux disease)     1. Continue to Treat inflammation:   A. Symbicort 160 - 2 inhalations twice a day with spacer  B. nasal fluticasone one spray each nostril one time per day  C. montelukast 10 mg tablet 1 time per day  2. Continue to Treat reflux:   A. omeprazole 40 mg twice a day  B. Famotidine 40 mg once a day in evening  C. no caffeine or chocolate consumption  3. If needed:   A. ProAir HFA or albuterol nebulization  B. cetirizine 10 mg tablet 1 time per day  C.  OTC Mucinex DM  D.  OTC nasal saline  4.  "Action plan" for flareup:   A.  Continue Symbicort twice a day  B.  Add Flovent 110 -2 inhalations twice a day  5. Return to clinic in 6 months or earlier if there is a problem  6.  Obtain COVID booster  Rachella appears to  be doing pretty well from a respiratory standpoint and her reflux appears to be under pretty good control as well with the therapy noted above.  She will continue on these anti-inflammatory agents for airway and continue on a proton pump inhibitor and H2 receptor blocker for her reflux and I will see her back in this clinic in 6 months.  Her snoring and her choking at night is not consistent with the results of her sleep study which did not identify any significant amount of sleep apnea.  Certainly if this becomes a more consistent problem she will probably need to have a repeat sleep study.  Allena Katz, MD Allergy / Immunology [place]

## 2020-12-15 ENCOUNTER — Encounter: Payer: Self-pay | Admitting: Allergy and Immunology

## 2021-01-14 DIAGNOSIS — K8020 Calculus of gallbladder without cholecystitis without obstruction: Secondary | ICD-10-CM | POA: Insufficient documentation

## 2021-05-05 ENCOUNTER — Other Ambulatory Visit: Payer: Self-pay | Admitting: Allergy and Immunology

## 2021-06-21 ENCOUNTER — Ambulatory Visit (INDEPENDENT_AMBULATORY_CARE_PROVIDER_SITE_OTHER): Payer: Medicare HMO | Admitting: Allergy and Immunology

## 2021-06-21 ENCOUNTER — Other Ambulatory Visit: Payer: Self-pay

## 2021-06-21 VITALS — BP 100/80 | HR 105 | Temp 97.6°F | Resp 18 | Ht 62.0 in | Wt 235.6 lb

## 2021-06-21 DIAGNOSIS — J3089 Other allergic rhinitis: Secondary | ICD-10-CM

## 2021-06-21 DIAGNOSIS — K219 Gastro-esophageal reflux disease without esophagitis: Secondary | ICD-10-CM

## 2021-06-21 DIAGNOSIS — J4540 Moderate persistent asthma, uncomplicated: Secondary | ICD-10-CM | POA: Diagnosis not present

## 2021-06-21 MED ORDER — FLOVENT HFA 110 MCG/ACT IN AERO: 2.0000 | INHALATION_SPRAY | Freq: Two times a day (BID) | RESPIRATORY_TRACT | 5 refills | Status: DC

## 2021-06-21 MED ORDER — ALBUTEROL SULFATE (2.5 MG/3ML) 0.083% IN NEBU: INHALATION_SOLUTION | RESPIRATORY_TRACT | 1 refills | Status: DC

## 2021-06-21 MED ORDER — OMEPRAZOLE 40 MG PO CPDR
DELAYED_RELEASE_CAPSULE | ORAL | 5 refills | Status: DC
Start: 2021-06-21 — End: 2021-12-27

## 2021-06-21 MED ORDER — VENTOLIN HFA 108 (90 BASE) MCG/ACT IN AERS: INHALATION_SPRAY | RESPIRATORY_TRACT | 1 refills | Status: DC

## 2021-06-21 MED ORDER — SYMBICORT 160-4.5 MCG/ACT IN AERO: 2.0000 | INHALATION_SPRAY | Freq: Two times a day (BID) | RESPIRATORY_TRACT | 5 refills | Status: DC

## 2021-06-21 MED ORDER — FAMOTIDINE 40 MG PO TABS: 40.0000 mg | ORAL_TABLET | Freq: Every day | ORAL | 5 refills | Status: DC

## 2021-06-21 MED ORDER — MONTELUKAST SODIUM 10 MG PO TABS: 10.0000 mg | ORAL_TABLET | Freq: Every day | ORAL | 5 refills | Status: DC

## 2021-06-21 MED ORDER — FLUTICASONE PROPIONATE 50 MCG/ACT NA SUSP: 1.0000 | Freq: Every day | NASAL | 5 refills | Status: DC

## 2021-06-21 MED ORDER — CETIRIZINE HCL 10 MG PO TABS: 10.0000 mg | ORAL_TABLET | Freq: Every day | ORAL | 5 refills | Status: DC

## 2021-06-21 NOTE — Patient Instructions (Addendum)
  1. Continue to Treat inflammation:   A. Symbicort 160 - 2 inhalations 1-2 times a day with spacer  B. nasal fluticasone - 1 spray each nostril 1 time per day  C. montelukast 10 mg tablet 1 time per day  2. Continue to Treat reflux:   A. omeprazole 40 mg twice a day  B. Famotidine 40 mg once a day in evening  C. no caffeine or chocolate consumption  3. If needed:   A. ProAir HFA or albuterol nebulization  B. cetirizine 10 mg tablet 1 time per day  C.  OTC Mucinex DM  D.  OTC nasal saline  4.  "Action plan" for flareup:   A.  Continue Symbicort twice a day  B.  Add Flovent 110 -2 inhalations twice a day  5.  Return to clinic in 6 months or earlier if there is a problem  6.  Obtain flu vaccine

## 2021-06-21 NOTE — Progress Notes (Signed)
[place]   Follow-up Note  Referring Provider: Joella Prince Primary Provider: Joella Prince Date of Office Visit: 06/21/2021  Subjective:   Mia Meyer (DOB: 25-Aug-1995) is a 25 y.o. female who returns to the [facility] [place] on 06/21/2021 in re-evaluation of the following:  HPI: Mia Meyer returns to this clinic in reevaluation of asthma and allergic rhinitis and LPR.  Her last visit to this clinic was 14 Dec 2020.  She has really done well since her last visit regarding her respiratory tract.  For the most part she will use Symbicort just 1 time per day and a nasal steroid along with a leukotriene modifier and have very little problems with either her upper or lower airway.  Rarely did she use a short acting bronchodilator.  However, she developed an issue with coughing and nasal congestion and sneezing as did her mom at the tail end of October for which she went to the urgent care center and was treated with azithromycin and prednisone.  She was COVID-negative with that event.  Fortunately, most of that issue has resolved.  Her reflux is under very good control as long as she remains on a aggressive plan for reflux including a proton pump inhibitor and H2 receptor blocker.  She has received 3 COVID vaccines.  Allergies as of 06/21/2021       Reactions   Molds & Smuts Swelling   Cough.    Other    Seasonal Allergies         Medication List    acetaminophen 500 MG tablet Commonly known as: TYLENOL Take by mouth.   amitriptyline 25 MG tablet Commonly known as: ELAVIL TAKE 1 TABLET NIGHTLY. MAY START WITH 1/2 TABLET FOR A FEW DAYS   azelastine 0.1 % nasal spray Commonly known as: ASTELIN PLACE 1 TO 2 SPRAYS IN EACH NOSTRIL TWICE DAILY FOR DRAINAGE   beclomethasone 40 MCG/ACT inhaler Commonly known as: Qvar Inhale 2 puffs into the lungs 2 (two) times daily.   benzonatate 100 MG  capsule Commonly known as: TESSALON Take 1 capsule by mouth 3 (three) times daily as needed.   budesonide-formoterol 160-4.5 MCG/ACT inhaler Commonly known as: Symbicort Inhale 2 puffs into the lungs 2 (two) times daily.   cetirizine 10 MG tablet Commonly known as: ZYRTEC Take 1 tablet (10 mg total) by mouth daily.   clindamycin-benzoyl peroxide gel Commonly known as: BENZACLIN Apply topically.   cyclobenzaprine 5 MG tablet Commonly known as: FLEXERIL Take 5 mg by mouth 2 (two) times daily. As directed.   diazepam 10 MG Gel Commonly known as: DIASTAT ACUDIAL   Docusate Sodium 100 MG/10ML Liqd Take 1 capsule by mouth daily.   famotidine 40 MG tablet Commonly known as: Pepcid Take 1 tablet (40 mg total) by mouth daily.   Ferrous Sulfate 28 MG Tabs Use 400 mg once daily   Flovent HFA 110 MCG/ACT inhaler Generic drug: fluticasone INHALE 2 PUFFS INTO THE LUNGS TWICE DAILY   fluticasone 50 MCG/ACT nasal spray Commonly known as: FLONASE Place 1 spray into both nostrils daily.   Full Kit Nebulizer Set Misc 1 kit by Does not apply route See admin instructions.   GuanFACINE HCl 3 MG Tb24 Take 3 mg by mouth daily.   ibuprofen 600 MG tablet Commonly known as: ADVIL Take 600 mg by mouth every 6 (six) hours as needed for pain.   iron polysaccharides 150 MG capsule Commonly  known as: NIFEREX Take 1 capsule by mouth daily.   levETIRAcetam 750 MG tablet Commonly known as: KEPPRA   montelukast 10 MG tablet Commonly known as: SINGULAIR Take 1 tablet (10 mg total) by mouth at bedtime.   norethindrone-ethinyl estradiol-FE 1-20 MG-MCG tablet Commonly known as: LOESTRIN FE Take by mouth.   [name] 24 Fe 1-20 MG-MCG(24) tablet Generic drug: Norethindrone Acetate-Ethinyl Estrad-FE [name] 24 Fe 1 mg-20 mcg (24)/75 mg (4) tablet  TAKE 1 TABLET BY MOUTH EVERY DAY   omeprazole 40 MG capsule Commonly known as: PRILOSEC TAKE 1 CAPSULE(40 MG) BY MOUTH TWICE DAILY    ondansetron 4 MG disintegrating tablet Commonly known as: ZOFRAN-ODT   polyethylene glycol powder 17 GM/SCOOP powder Commonly known as: GLYCOLAX/MIRALAX 1/2 TO 1 CAPFUL IN 8OZ BEVERAGE DAILY AS NEEDED   ranitidine 300 MG tablet Commonly known as: ZANTAC Take 1 tablet (300 mg total) by mouth at bedtime.   Setlakin 0.15-0.03 MG tablet Generic drug: levonorgestrel-ethinyl estradiol Take 1 tablet by mouth daily.   SUMAtriptan 100 MG tablet Commonly known as: IMITREX Take 1 tablet at onset of migraine. Can repeat once in 2 hr if needed. No more than 2 tabs in 24 hr.   triamcinolone cream 0.1 % Commonly known as: KENALOG Apply 1 application topically 2 (two) times daily.   Ventolin HFA 108 (90 Base) MCG/ACT inhaler Generic drug: albuterol INHALE 2 PUFFS INTO THE LUNGS EVERY 4 TO 6 HOURS AS NEEDED FOR COUGH OR WHEEZING   albuterol (2.5 MG/3ML) 0.083% nebulizer solution Commonly known as: PROVENTIL USE 3 ML VIA NEBULIZER EVERY 4 HOURS AS NEEDED FOR WHEEZING OR COUGH    Past Medical History:  Diagnosis Date   Allergy    mold allergy   Asthma    Cerebral palsy ([facility])    Common migraine 11/27/2017   GERD (gastroesophageal reflux disease)    Neonatal stroke [facility])     Past Surgical History:  Procedure Laterality Date   SHUNT REPLACEMENT Left    Shunt replaced in 2002   TENDON RELEASE Right     Review of systems negative except as noted in HPI / PMHx or noted below:  Review of Systems  Constitutional: Negative.   HENT: Negative.    Eyes: Negative.   Respiratory: Negative.    Cardiovascular: Negative.   Gastrointestinal: Negative.   Genitourinary: Negative.   Musculoskeletal: Negative.   Skin: Negative.   Neurological: Negative.   Endo/Heme/Allergies: Negative.   Psychiatric/Behavioral: Negative.      Objective:   Vitals:   06/21/21 1545  BP: 100/80  Pulse: (!) 105  Resp: 18  Temp: 97.6 F (36.4 C)  SpO2: 100%   Height: _0  (157.5 cm)  Weight: 235 lb  9.6 oz (106.9 kg)   Physical Exam Constitutional:      Appearance: She is not diaphoretic.  HENT:     Head: Normocephalic.     Right Ear: Tympanic membrane, ear canal and external ear normal.     Left Ear: Tympanic membrane, ear canal and external ear normal.     Nose: Nose normal. No mucosal edema or rhinorrhea.     Mouth/Throat:     Pharynx: Uvula midline. No oropharyngeal exudate.  Eyes:     Conjunctiva/sclera: Conjunctivae normal.  Neck:     Thyroid: No thyromegaly.     Trachea: Trachea normal. No tracheal tenderness or tracheal deviation.  Cardiovascular:     Rate and Rhythm: Normal rate and regular rhythm.     Heart sounds: Normal  heart sounds, S1 normal and S2 normal. No murmur heard. Pulmonary:     Effort: No respiratory distress.     Breath sounds: Normal breath sounds. No stridor. No wheezing or rales.  Lymphadenopathy:     Head:     Right side of head: No tonsillar adenopathy.     Left side of head: No tonsillar adenopathy.     Cervical: No cervical adenopathy.  Skin:    Findings: No erythema or rash.     Nails: There is no clubbing.  Neurological:     Mental Status: She is alert.    Diagnostics:    Spirometry was performed and demonstrated an FEV1 of 1.48 at 55 % of predicted.  She had a difficult time performing this spirometric maneuver.  Assessment and Plan:   1. Asthma, moderate persistent, well-controlled   2. Other allergic rhinitis   3. LPRD (laryngopharyngeal reflux disease)     1. Continue to Treat inflammation:   A. Symbicort 160 - 2 inhalations 1-2 times a day with spacer  B. nasal fluticasone - 1 spray each nostril 1 time per day  C. montelukast 10 mg tablet 1 time per day  2. Continue to Treat reflux:   A. omeprazole 40 mg twice a day  B. Famotidine 40 mg once a day in evening  C. no caffeine or chocolate consumption  3. If needed:   A. ProAir HFA or albuterol nebulization  B. cetirizine 10 mg tablet 1 time per day  C.  OTC  Mucinex DM  D.  OTC nasal saline  4.  "Action plan" for flareup:   A.  Continue Symbicort twice a day  B.  Add Flovent 110 -2 inhalations twice a day  5.  Return to clinic in 6 months or earlier if there is a problem  6.  Obtain flu vaccine  Syvilla appears to be doing relatively well while she utilizes a collection of anti-inflammatory agents for her airway and therapy directed against reflux as noted above.  Her most recent flareup, which has been her only flareups since her last visit, appeared to be triggered by a viral respiratory tract infection and fortunately that has completely resolved.  Assuming she does well with the plan noted above I will see her back in this clinic in 6 months or earlier if there is a problem.  Allena Katz, MD Allergy / Immunology [place]

## 2021-06-22 ENCOUNTER — Encounter: Payer: Self-pay | Admitting: Allergy and Immunology

## 2021-12-27 ENCOUNTER — Ambulatory Visit (INDEPENDENT_AMBULATORY_CARE_PROVIDER_SITE_OTHER): Payer: Medicare HMO | Admitting: Allergy and Immunology

## 2021-12-27 ENCOUNTER — Encounter: Payer: Self-pay | Admitting: Allergy and Immunology

## 2021-12-27 VITALS — BP 100/72 | HR 82 | Temp 97.6°F | Resp 16 | Ht 62.0 in | Wt 235.8 lb

## 2021-12-27 DIAGNOSIS — J4540 Moderate persistent asthma, uncomplicated: Secondary | ICD-10-CM | POA: Diagnosis not present

## 2021-12-27 DIAGNOSIS — R0683 Snoring: Secondary | ICD-10-CM

## 2021-12-27 DIAGNOSIS — K219 Gastro-esophageal reflux disease without esophagitis: Secondary | ICD-10-CM

## 2021-12-27 DIAGNOSIS — J3089 Other allergic rhinitis: Secondary | ICD-10-CM

## 2021-12-27 MED ORDER — FAMOTIDINE 40 MG PO TABS: 40.0000 mg | ORAL_TABLET | Freq: Every evening | ORAL | 5 refills | Status: DC

## 2021-12-27 MED ORDER — ALBUTEROL SULFATE (2.5 MG/3ML) 0.083% IN NEBU: 2.5000 mg | INHALATION_SOLUTION | RESPIRATORY_TRACT | 1 refills | Status: DC | PRN

## 2021-12-27 MED ORDER — SYMBICORT 160-4.5 MCG/ACT IN AERO: 2.0000 | INHALATION_SPRAY | Freq: Two times a day (BID) | RESPIRATORY_TRACT | 5 refills | Status: DC

## 2021-12-27 MED ORDER — CETIRIZINE HCL 10 MG PO TABS: 10.0000 mg | ORAL_TABLET | Freq: Two times a day (BID) | ORAL | 5 refills | Status: DC | PRN

## 2021-12-27 MED ORDER — OMEPRAZOLE 40 MG PO CPDR: 40.0000 mg | DELAYED_RELEASE_CAPSULE | Freq: Two times a day (BID) | ORAL | 5 refills | Status: DC

## 2021-12-27 MED ORDER — VENTOLIN HFA 108 (90 BASE) MCG/ACT IN AERS: 2.0000 | INHALATION_SPRAY | RESPIRATORY_TRACT | 1 refills | Status: DC | PRN

## 2021-12-27 MED ORDER — FLOVENT HFA 110 MCG/ACT IN AERO: 2.0000 | INHALATION_SPRAY | Freq: Two times a day (BID) | RESPIRATORY_TRACT | 5 refills | Status: DC

## 2021-12-27 MED ORDER — FLUTICASONE PROPIONATE 50 MCG/ACT NA SUSP: 1.0000 | Freq: Every day | NASAL | 5 refills | Status: DC

## 2021-12-27 MED ORDER — MONTELUKAST SODIUM 10 MG PO TABS: 10.0000 mg | ORAL_TABLET | Freq: Every day | ORAL | 5 refills | Status: DC

## 2021-12-27 NOTE — Patient Instructions (Addendum)
?  1. Continue to Treat inflammation: ? ? A. Symbicort 160 - 2 inhalations 3-7 times per week ? B. nasal fluticasone - 1 spray each nostril 3-7 times per week ? C. montelukast 10 mg tablet 1 time per day ? ?2. Continue to Treat reflux: ? ? A. omeprazole 40 mg twice a day ? B. Famotidine 40 mg once a day in evening ? C. no caffeine or chocolate consumption ? ?3. If needed: ? ? A. ProAir HFA or albuterol nebulization ? B. cetirizine 10 mg tablet 1 time per day ? C.  OTC Mucinex DM ? D.  OTC nasal saline ? ?4.  "Action plan" for flareup: ? ? A.  Increase Symbicort 160 - 2 inhalations twice a day ? B.  Add Flovent 110 -2 inhalations twice a day ? ?5.  Return to clinic in 6 months or earlier if there is a problem ? ? ? ?

## 2021-12-27 NOTE — Progress Notes (Signed)
? ?[place] ? ? ?Follow-up Note ? ?Referring Provider: Curt Jews, PA-C ?Primary Provider: Curt Jews, PA-C ?Date of Office Visit: 12/27/2021 ? ?Subjective:  ? ?Mia Meyer (DOB: 14-Jan-1996) is a 26 y.o. female who returns to the [facility] on 12/27/2021 in re-evaluation of the following: ? ?HPI: [name] returns to this clinic in evaluation of asthma, allergic rhinitis, and LPR.  Her last visit to this clinic was 21 June 2021. ? ?Her asthma has been doing okay although she has had a little bit more cough lately.  It should be noted that she discontinued her Symbicort over the course of the past several months.  It does not sound as though she has required a systemic steroid to treat an exacerbation of asthma.  Her requirement for short acting bronchodilator is pretty rare. ? ?Her nose is doing okay although she has had some nasal stuffiness.  Significantly she has been having more snoring lately.  She does not use any nasal steroid at this point.  She has not required an antibiotic to treat an episode of sinusitis. ? ?Her reflux is under pretty good control at this point in time on her current plan.  She has apparently been having an abdominal pain issue and evaluation has led to the diagnosis of gallbladder issues and apparently there is a discussion about having her gallbladder removed. ? ?Allergies as of 12/27/2021   ? ?   Reactions  ? Caffeine Other (See Comments)  ? Pt lethargic with it   ? Oxycodone Itching  ? Mom states it's this particular formulation. Per Mother- Only Oxycodone from [facility] is okay. ?Mom states it's this particular formulation. Per Mother- Only Oxycodone from [facility] is okay.  ? Molds & Smuts Swelling  ? Cough.   ? Other   ? Seasonal Allergies   ? ?  ? ?  ?Medication List  ? ? ?acetaminophen 500 MG tablet ?Commonly known as: TYLENOL ?Take by mouth. ?  ?albuterol (2.5 MG/3ML) 0.083% nebulizer solution ?Commonly known as:  PROVENTIL ?USE 3 ML VIA NEBULIZER EVERY 4 HOURS AS NEEDED FOR WHEEZING OR COUGH ?  ?Ventolin HFA 108 (90 Base) MCG/ACT inhaler ?Generic drug: albuterol ?INHALE 2 PUFFS INTO THE LUNGS EVERY 4 TO 6 HOURS AS NEEDED FOR COUGH OR WHEEZING ?  ?amitriptyline 25 MG tablet ?Commonly known as: ELAVIL ?TAKE 1 TABLET NIGHTLY. MAY START WITH 1/2 TABLET FOR A FEW DAYS ?  ?azelastine 0.1 % nasal spray ?Commonly known as: ASTELIN ?PLACE 1 TO 2 SPRAYS IN EACH NOSTRIL TWICE DAILY FOR DRAINAGE ?  ?beclomethasone 40 MCG/ACT inhaler ?Commonly known as: Qvar ?Inhale 2 puffs into the lungs 2 (two) times daily. ?  ?benzonatate 100 MG capsule ?Commonly known as: TESSALON ?Take 1 capsule by mouth 3 (three) times daily as needed. ?  ?cetirizine 10 MG tablet ?Commonly known as: ZYRTEC ?Take 1 tablet (10 mg total) by mouth daily. ?  ?clindamycin-benzoyl peroxide gel ?Commonly known as: BENZACLIN ?Apply topically. ?  ?cyclobenzaprine 5 MG tablet ?Commonly known as: FLEXERIL ?Take 5 mg by mouth 2 (two) times daily. As directed. ?  ?diazepam 10 MG Gel ?Commonly known as: DIASTAT ACUDIAL ?  ?Docusate Sodium 100 MG/10ML Liqd ?Take 1 capsule by mouth daily. ?  ?famotidine 40 MG tablet ?Commonly known as: Pepcid ?Take 1 tablet (40 mg total) by mouth daily. ?  ?Ferrous Sulfate 28 MG Tabs ?Use 400 mg once daily ?  ?Flovent HFA 110  MCG/ACT inhaler ?Generic drug: fluticasone ?Inhale 2 puffs into the lungs 2 (two) times daily. ?  ?fluticasone 50 MCG/ACT nasal spray ?Commonly known as: FLONASE ?Place 1 spray into both nostrils daily. ?  ?Full Kit Nebulizer Set Misc ?1 kit by Does not apply route See admin instructions. ?  ?GuanFACINE HCl 3 MG Tb24 ?Take 3 mg by mouth daily. ?  ?[name] 24 Fe 1-20 MG-MCG(24) tablet ?Generic drug: Norethindrone Acetate-Ethinyl Estrad-FE ?[name] 24 Fe 1 mg-20 mcg (24)/75 mg (4) tablet ? TAKE 1 TABLET BY MOUTH EVERY DAY ?  ?ibuprofen 600 MG tablet ?Commonly known as: ADVIL ?Take 600 mg by mouth every 6 (six) hours as needed for  pain. ?  ?iron polysaccharides 150 MG capsule ?Commonly known as: NIFEREX ?Take 1 capsule by mouth daily. ?  ?levETIRAcetam 750 MG tablet ?Commonly known as: KEPPRA ?  ?montelukast 10 MG tablet ?Commonly known as: SINGULAIR ?Take 1 tablet (10 mg total) by mouth at bedtime. ?  ?omeprazole 40 MG capsule ?Commonly known as: PRILOSEC ?TAKE 1 CAPSULE(40 MG) BY MOUTH TWICE DAILY ?  ?ondansetron 4 MG disintegrating tablet ?Commonly known as: ZOFRAN-ODT ?  ?polyethylene glycol powder 17 GM/SCOOP powder ?Commonly known as: GLYCOLAX/MIRALAX ?1/2 TO 1 CAPFUL IN 8OZ BEVERAGE DAILY AS NEEDED ?  ?ranitidine 300 MG tablet ?Commonly known as: ZANTAC ?Take 1 tablet (300 mg total) by mouth at bedtime. ?  ?Setlakin 0.15-0.03 MG tablet ?Generic drug: levonorgestrel-ethinyl estradiol ?Take 1 tablet by mouth daily. ?  ?SUMAtriptan 100 MG tablet ?Commonly known as: IMITREX ?Take 1 tablet at onset of migraine. Can repeat once in 2 hr if needed. No more than 2 tabs in 24 hr. ?  ?Symbicort 160-4.5 MCG/ACT inhaler ?Generic drug: budesonide-formoterol ?Inhale 2 puffs into the lungs 2 (two) times daily. ?  ?triamcinolone cream 0.1 % ?Commonly known as: KENALOG ?Apply 1 application topically 2 (two) times daily. ?  ? ?Past Medical History:  ?Diagnosis Date  ? Allergy   ? mold allergy  ? Asthma   ? Cerebral palsy ([place])   ? Common migraine 11/27/2017  ? GERD (gastroesophageal reflux disease)   ? Neonatal stroke [facility])   ? ? ?Past Surgical History:  ?Procedure Laterality Date  ? SHUNT REPLACEMENT Left   ? Shunt replaced in 2002  ? TENDON RELEASE Right   ? ? ?Review of systems negative except as noted in HPI / PMHx or noted below: ? ?Review of Systems  ?Constitutional: Negative.   ?HENT: Negative.    ?Eyes: Negative.   ?Respiratory: Negative.    ?Cardiovascular: Negative.   ?Gastrointestinal: Negative.   ?Genitourinary: Negative.   ?Musculoskeletal: Negative.   ?Skin: Negative.   ?Neurological: Negative.   ?Endo/Heme/Allergies: Negative.    ?Psychiatric/Behavioral: Negative.    ? ? ?Objective:  ? ?Vitals:  ? 12/27/21 1640  ?BP: 100/72  ?Pulse: 82  ?Resp: 16  ?Temp: 97.6 ?F (36.4 ?C)  ?SpO2: 100%  ? ?Height: _0  (157.5 cm)  ?Weight: 235 lb 12.8 oz (107 kg)  ? ?Physical Exam ?Constitutional:   ?   Appearance: She is not diaphoretic.  ?HENT:  ?   Head: Normocephalic.  ?   Right Ear: Tympanic membrane, ear canal and external ear normal.  ?   Left Ear: Tympanic membrane, ear canal and external ear normal.  ?   Nose: Nose normal. No mucosal edema or rhinorrhea.  ?   Mouth/Throat:  ?   Pharynx: Uvula midline. No oropharyngeal exudate.  ?Eyes:  ?   Conjunctiva/sclera: Conjunctivae normal.  ?  Neck:  ?   Thyroid: No thyromegaly.  ?   Trachea: Trachea normal. No tracheal tenderness or tracheal deviation.  ?Cardiovascular:  ?   Rate and Rhythm: Normal rate and regular rhythm.  ?   Heart sounds: Normal heart sounds, S1 normal and S2 normal. No murmur heard. ?Pulmonary:  ?   Effort: No respiratory distress.  ?   Breath sounds: Normal breath sounds. No stridor. No wheezing or rales.  ?Lymphadenopathy:  ?   Head:  ?   Right side of head: No tonsillar adenopathy.  ?   Left side of head: No tonsillar adenopathy.  ?   Cervical: No cervical adenopathy.  ?Skin: ?   Findings: No erythema or rash.  ?   Nails: There is no clubbing.  ?Neurological:  ?   Mental Status: She is alert.  ? ? ?Diagnostics:  ?  ?Spirometry was performed and demonstrated an FEV1 of 1.62 at 61 % of predicted. ? ?Assessment and Plan:  ? ?1. Asthma, moderate persistent, well-controlled   ?2. Other allergic rhinitis   ?3. Snoring   ?4. LPRD (laryngopharyngeal reflux disease)   ? ?1. Continue to Treat inflammation: ? ? A. Symbicort 160 - 2 inhalations 3-7 times per week ? B. nasal fluticasone - 1 spray each nostril 3-7 times per week ? C. montelukast 10 mg tablet 1 time per day ? ?2. Continue to Treat reflux: ? ? A. omeprazole 40 mg twice a day ? B. Famotidine 40 mg once a day in evening ? C. no caffeine  or chocolate consumption ? ?3. If needed: ? ? A. ProAir HFA or albuterol nebulization ? B. cetirizine 10 mg tablet 1 time per day ? C.  OTC Mucinex DM ? D.  OTC nasal saline ? ?4.  "Action plan" for flareup: ? ?

## 2021-12-28 ENCOUNTER — Encounter: Payer: Self-pay | Admitting: Allergy and Immunology

## 2022-02-06 ENCOUNTER — Encounter: Payer: Self-pay | Admitting: Family

## 2022-02-06 ENCOUNTER — Ambulatory Visit (INDEPENDENT_AMBULATORY_CARE_PROVIDER_SITE_OTHER): Payer: Medicare HMO | Admitting: Family

## 2022-02-06 VITALS — BP 112/80 | HR 108 | Temp 97.9°F | Resp 16 | Wt 234.0 lb

## 2022-02-06 DIAGNOSIS — J3089 Other allergic rhinitis: Secondary | ICD-10-CM

## 2022-02-06 DIAGNOSIS — J4541 Moderate persistent asthma with (acute) exacerbation: Secondary | ICD-10-CM

## 2022-02-06 DIAGNOSIS — K219 Gastro-esophageal reflux disease without esophagitis: Secondary | ICD-10-CM

## 2022-02-06 DIAGNOSIS — J4540 Moderate persistent asthma, uncomplicated: Secondary | ICD-10-CM

## 2022-02-06 DIAGNOSIS — R0683 Snoring: Secondary | ICD-10-CM

## 2022-02-15 ENCOUNTER — Telehealth: Payer: Self-pay

## 2022-02-15 NOTE — Telephone Encounter (Signed)
Thank you :)

## 2022-02-15 NOTE — Telephone Encounter (Signed)
I called mom to discuss the referral I placed to [place]. Mom states she didn't want a 2nd opinion for [name]. She states she has been dealing with the current provider for a while & will stick with them.  Referral has been closed.

## 2022-02-15 NOTE — Telephone Encounter (Signed)
-----   Message from Althea Charon, [place] sent at 02/06/2022  2:42 PM EDT ----- Please refer to GI due to uncontrolled reflux on ompeprazole 40 mg twice a day and famotidine 40 mg once a day . Has seen GI in the past [facility]). Mom would like a second opinion

## 2022-02-28 ENCOUNTER — Other Ambulatory Visit: Payer: Self-pay

## 2022-02-28 ENCOUNTER — Ambulatory Visit (INDEPENDENT_AMBULATORY_CARE_PROVIDER_SITE_OTHER): Payer: Medicare HMO | Admitting: Allergy and Immunology

## 2022-02-28 ENCOUNTER — Encounter: Payer: Self-pay | Admitting: Allergy and Immunology

## 2022-02-28 VITALS — BP 124/80 | HR 70 | Temp 97.2°F | Resp 16 | Ht 62.0 in | Wt 234.6 lb

## 2022-02-28 DIAGNOSIS — J4540 Moderate persistent asthma, uncomplicated: Secondary | ICD-10-CM

## 2022-02-28 DIAGNOSIS — J3089 Other allergic rhinitis: Secondary | ICD-10-CM

## 2022-02-28 DIAGNOSIS — K219 Gastro-esophageal reflux disease without esophagitis: Secondary | ICD-10-CM

## 2022-02-28 MED ORDER — VENTOLIN HFA 108 (90 BASE) MCG/ACT IN AERS: 2.0000 | INHALATION_SPRAY | RESPIRATORY_TRACT | 1 refills | Status: DC | PRN

## 2022-02-28 MED ORDER — BUDESONIDE 0.5 MG/2ML IN SUSP: 0.5000 mg | Freq: Two times a day (BID) | RESPIRATORY_TRACT | 5 refills | Status: DC

## 2022-02-28 MED ORDER — FAMOTIDINE 40 MG PO TABS: 40.0000 mg | ORAL_TABLET | Freq: Every evening | ORAL | 5 refills | Status: DC

## 2022-02-28 MED ORDER — FLUTICASONE PROPIONATE 50 MCG/ACT NA SUSP: 1.0000 | Freq: Every day | NASAL | 5 refills | Status: DC

## 2022-02-28 MED ORDER — ALBUTEROL SULFATE (2.5 MG/3ML) 0.083% IN NEBU: 2.5000 mg | INHALATION_SOLUTION | RESPIRATORY_TRACT | 1 refills | Status: DC | PRN

## 2022-02-28 MED ORDER — MONTELUKAST SODIUM 10 MG PO TABS: 10.0000 mg | ORAL_TABLET | Freq: Every day | ORAL | 5 refills | Status: DC

## 2022-02-28 MED ORDER — ARFORMOTEROL TARTRATE 15 MCG/2ML IN NEBU: 15.0000 ug | INHALATION_SOLUTION | Freq: Two times a day (BID) | RESPIRATORY_TRACT | 5 refills | Status: DC

## 2022-02-28 MED ORDER — OMEPRAZOLE 40 MG PO CPDR: 40.0000 mg | DELAYED_RELEASE_CAPSULE | Freq: Two times a day (BID) | ORAL | 5 refills | Status: DC

## 2022-02-28 NOTE — Patient Instructions (Addendum)
  1. Continue to Treat inflammation:   A. Budesonide 0.5 mg - nebulize 2 times per day  C. Brovana (or equivalent) - nebulize 2 times per day  B. nasal fluticasone - 1 spray each nostril 2 times per day  C. montelukast 10 mg tablet 1 time per day  2. Continue to Treat reflux / LPR:   A. omeprazole 40 mg twice a day  B. Famotidine 40 mg once a day in evening  C. no caffeine or chocolate consumption  D. Replace throat clearing with swallowing/drinking maneuver  3. If needed:   A. ProAir HFA or albuterol nebulization  B. cetirizine 10 mg tablet 1 time per day  C.  OTC Mucinex DM  D.  OTC nasal saline  4.  Return to clinic in 4 weeks or earlier if there is a problem  5. Blood - Area 2 aeroallergen profile  6. Consider evaluation with GI concerning possible Nissen fundoplication

## 2022-02-28 NOTE — Progress Notes (Unsigned)
[place] [facility] [place] [facility] [place] [facility] [place] [facility] [place]   Follow-up Note  Referring Provider: Barrington Ellison Primary Provider: Barrington Ellison Date of Office Visit: 02/28/2022  Subjective:   Mia Meyer (DOB: [birth year]) is a 26 y.o. female who [facility] in re-evaluation of the following:  HPI: Mia Meyer returns to this clinic in evaluation of asthma, allergic rhinitis, LPR.  Her last visit to this clinic with me was 27 Dec 2021 and she visited with her nurse practitioner on 06 February 2022.  Over the course of the past 6 weeks or so she has been treated with broad-spectrum antibiotics and systemic steroids by multiple physicians for issues with cough and throat clearing and posttussive emesis and coating in her throat without any significant upper airway symptoms.  She uses a bronchodilator several times per day and she is not really sure that this helps her very much.  During her last visit it was suggested that she go see her GI doctor regarding her rather significant reflux but that she had such a bad interaction with the GI doctor when she was much younger she is not very interested in pursuing this issue.  Apparently the recommendation at that point in time is that she undergo surgery for her reflux and she is less than excited about undergoing that procedure.  Allergies as of 02/28/2022       Reactions   Caffeine Other (See Comments)   Pt lethargic with it    Oxycodone Itching   Mom states it's this particular formulation. Per Mother- Only [facility]one from [facility] is okay. Mom states it's this particular formulation. Per Mother- Only [facility]codone from [facility] is okay.   Molds & Smuts Swelling   Cough.    Other    Seasonal Allergies         Medication List    acetaminophen 500 MG tablet Commonly known as: TYLENOL Take by mouth.   amitriptyline 25 MG tablet Commonly known as: ELAVIL TAKE 1 TABLET NIGHTLY. MAY START WITH  1/2 TABLET FOR A FEW DAYS   azelastine 0.1 % nasal spray Commonly known as: ASTELIN PLACE 1 TO 2 SPRAYS IN EACH NOSTRIL TWICE DAILY FOR DRAINAGE   beclomethasone 40 MCG/ACT inhaler Commonly known as: Qvar Inhale 2 puffs into the lungs 2 (two) times daily.   cetirizine 10 MG tablet Commonly known as: ZYRTEC Take 1 tablet (10 mg total) by mouth 2 (two) times daily as needed for allergies (Can take an extra dose during flare ups.).   clindamycin-benzoyl peroxide gel Commonly known as: BENZACLIN Apply topically.   cyclobenzaprine 5 MG tablet Commonly known as: FLEXERIL Take 5 mg by mouth 2 (two) times daily. As directed.   diazepam 10 MG Gel Commonly known as: DIASTAT ACUDIAL   Docusate Sodium 100 MG/10ML Liqd Take 1 capsule by mouth daily.   famotidine 40 MG tablet Commonly known as: Pepcid Take 1 tablet (40 mg total) by mouth at bedtime.   Ferrous Sulfate 28 MG Tabs Use 400 mg once daily   Flovent HFA 110 MCG/ACT inhaler Generic drug: fluticasone Inhale 2 puffs into the lungs 2 (two) times daily. Use during asthma flares   fluticasone 50 MCG/ACT nasal spray Commonly known as: FLONASE Place 1 spray into both nostrils daily.   Full Kit Nebulizer Set Misc 1 kit by Does not apply route See admin instructions.   GuanFACINE HCl 3 MG Tb24 Take 3 mg by mouth daily.  [name] 24 Fe 1-20 MG-MCG(24) tablet Generic drug: Norethindrone Acetate-Ethinyl Estrad-FE [name] 24 Fe 1 mg-20 mcg (24)/75 mg (4) tablet  TAKE 1 TABLET BY MOUTH EVERY DAY   ibuprofen 600 MG tablet Commonly known as: ADVIL Take 600 mg by mouth every 6 (six) hours as needed for pain.   iron polysaccharides 150 MG capsule Commonly known as: NIFEREX Take 1 capsule by mouth daily.   levETIRAcetam 750 MG tablet Commonly known as: KEPPRA   montelukast 10 MG tablet Commonly known as: SINGULAIR Take 1 tablet (10 mg total) by mouth at bedtime.   omeprazole 40 MG capsule Commonly known as: PRILOSEC Take  1 capsule (40 mg total) by mouth 2 (two) times daily. TAKE 1 CAPSULE(40 MG) BY MOUTH TWICE DAILY   ondansetron 4 MG disintegrating tablet Commonly known as: ZOFRAN-ODT   polyethylene glycol powder 17 GM/SCOOP powder Commonly known as: GLYCOLAX/MIRALAX 1/2 TO 1 CAPFUL IN 8OZ BEVERAGE DAILY AS NEEDED   ranitidine 300 MG tablet Commonly known as: ZANTAC Take 1 tablet (300 mg total) by mouth at bedtime.   Setlakin 0.15-0.03 MG tablet Generic drug: levonorgestrel-ethinyl estradiol Take 1 tablet by mouth daily.   SUMAtriptan 100 MG tablet Commonly known as: IMITREX Take 1 tablet at onset of migraine. Can repeat once in 2 hr if needed. No more than 2 tabs in 24 hr.   Symbicort 160-4.5 MCG/ACT inhaler Generic drug: budesonide-formoterol Inhale 2 puffs into the lungs 2 (two) times daily.   triamcinolone cream 0.1 % Commonly known as: KENALOG Apply 1 application topically 2 (two) times daily.   Ventolin HFA 108 (90 Base) MCG/ACT inhaler Generic drug: albuterol Inhale 2 puffs into the lungs every 4 (four) hours as needed for wheezing or shortness of breath. INHALE 2 PUFFS INTO THE LUNGS EVERY 4 TO 6 HOURS AS NEEDED FOR COUGH OR WHEEZING   albuterol (2.5 MG/3ML) 0.083% nebulizer solution Commonly known as: PROVENTIL Take 3 mLs (2.5 mg total) by nebulization every 4 (four) hours as needed for wheezing or shortness of breath. USE 3 ML VIA NEBULIZER EVERY 4 HOURS AS NEEDED FOR WHEEZING OR COUGH    Past Medical History:  Diagnosis Date   Allergy    mold allergy   Asthma    Cerebral palsy ([facility])    Common migraine 11/27/2017   GERD (gastroesophageal reflux disease)    Neonatal stroke [facility])     Past Surgical History:  Procedure Laterality Date   SHUNT REPLACEMENT Left    Shunt replaced in 2002   TENDON RELEASE Right     Review of systems negative except as noted in HPI / PMHx or noted below:  Review of Systems  Constitutional: Negative.   HENT: Negative.    Eyes: Negative.    Respiratory: Negative.    Cardiovascular: Negative.   Gastrointestinal: Negative.   Genitourinary: Negative.   Musculoskeletal: Negative.   Skin: Negative.   Neurological: Negative.   Endo/Heme/Allergies: Negative.   Psychiatric/Behavioral: Negative.       Objective:   Vitals:   02/28/22 0936  BP: 124/80  Pulse: 70  Resp: 16  Temp: (!) 97.2 F (36.2 C)  SpO2: 97%   Height: 5\' 2"  (157.5 cm)  Weight: 234 lb 9.6 oz (106.4 kg)   Physical Exam Constitutional:      Appearance: She is not diaphoretic.     Comments: Throat clearing  HENT:     Head: Normocephalic.     Right Ear: Tympanic membrane, ear canal and external ear normal.  Left Ear: Tympanic membrane, ear canal and external ear normal.     Nose: Nose normal. No mucosal edema or rhinorrhea.     Mouth/Throat:     Pharynx: Uvula midline. No oropharyngeal exudate.  Eyes:     Conjunctiva/sclera: Conjunctivae normal.  Neck:     Thyroid: No thyromegaly.     Trachea: Trachea normal. No tracheal tenderness or tracheal deviation.  Cardiovascular:     Rate and Rhythm: Normal rate and regular rhythm.     Heart sounds: Normal heart sounds, S1 normal and S2 normal. No murmur heard. Pulmonary:     Effort: No respiratory distress.     Breath sounds: Normal breath sounds. No stridor. No wheezing or rales.  Lymphadenopathy:     Head:     Right side of head: No tonsillar adenopathy.     Left side of head: No tonsillar adenopathy.     Cervical: No cervical adenopathy.  Skin:    Findings: No erythema or rash.     Nails: There is no clubbing.  Neurological:     Mental Status: She is alert.     Diagnostics:    Spirometry was performed and demonstrated an FEV1 of 1.50 at 56 % of predicted.  The patient had an Asthma Control Test with the following results:  .    Results of a head MRI obtained 17 February 2022 identified the following:  Paranasal Sinuses: Mucosal thickening and retention cysts in the right  maxillary and  left sphenoid sinuses.   Results of blood tests obtained 31 January 2022 identified WBC 6.1, absolute eosinophil 100, absolute lymphocyte 2000, hemoglobin 12.8, platelet 308.  Assessment and Plan:   1. Not well controlled moderate persistent asthma   2. Other allergic rhinitis   3. LPRD (laryngopharyngeal reflux disease)     1. Continue to Treat inflammation:   A. Budesonide 0.5 mg - nebulize 2 times per day  C. Brovana (or equivalent) - nebulize 2 times per day  B. nasal fluticasone - 1 spray each nostril 2 times per day  C. montelukast 10 mg tablet 1 time per day  2. Continue to Treat reflux / LPR:   A. omeprazole 40 mg twice a day  B. Famotidine 40 mg once a day in evening  C. no caffeine or chocolate consumption  D. Replace throat clearing with swallowing/drinking maneuver  3. If needed:   A. ProAir HFA or albuterol nebulization  B. cetirizine 10 mg tablet 1 time per day  C.  OTC Mucinex DM  D.  OTC nasal saline  4.  Return to clinic in 4 weeks or earlier if there is a problem  5. Blood - area 2 aeroallergan profile  5. Consider evaluation with GI concerning Nissen fundoplication  D[place] appears to have issues with her respiratory tract most likely from 3 different insults including atopic disease, reflux induced respiratory disease, and the trauma created in her [place] secondary to all of her throat clearing.  We will treat her with a combination of anti-inflammatory agents, continued aggressive therapy directed against reflux, and will further define her aeroallergen hypersensitivity with the blood test noted above.  I will see her back in this clinic in 4 weeks or earlier if there is a problem.  Allena Katz, MD Allergy / Immunology [place]

## 2022-03-01 ENCOUNTER — Encounter: Payer: Self-pay | Admitting: Allergy and Immunology

## 2022-03-03 ENCOUNTER — Telehealth: Payer: Self-pay | Admitting: *Deleted

## 2022-03-03 NOTE — Telephone Encounter (Signed)
PA has been submitted through CoverMyMeds for Arformoterol nebulizer solution (Brovana) and is currently pending approval/denial.

## 2022-03-06 ENCOUNTER — Other Ambulatory Visit: Payer: Self-pay | Admitting: *Deleted

## 2022-03-06 LAB — ALLERGENS W/TOTAL IGE AREA 2
Alternaria Alternata IgE: <0.1 kU/L
Aspergillus Fumigatus IgE: <0.1 kU/L
Bermuda Grass IgE: <0.1 kU/L
Cat Dander IgE: <0.1 kU/L
Cedar, Mountain IgE: <0.1 kU/L
Cladosporium Herbarum IgE: <0.1 kU/L
Cockroach, German IgE: 1.61 kU/L — AB
Common Silver Birch IgE: <0.1 kU/L
Cottonwood IgE: <0.1 kU/L
D Farinae IgE: 0.29 kU/L — AB
D Pteronyssinus IgE: 0.31 kU/L — AB
Dog Dander IgE: <0.1 kU/L
Elm, American IgE: <0.1 kU/L
IgE (Immunoglobulin E), Serum: 236 IU/mL (ref 6–495)
Johnson Grass IgE: <0.1 kU/L
Maple/Box Elder IgE: <0.1 kU/L
Mouse Urine IgE: <0.1 kU/L
Oak, White IgE: <0.1 kU/L
Pecan, Hickory IgE: <0.1 kU/L
Penicillium Chrysogen IgE: <0.1 kU/L
Pigweed, Rough IgE: <0.1 kU/L
Ragweed, Short IgE: <0.1 kU/L
Sheep Sorrel IgE Qn: <0.1 kU/L
Timothy Grass IgE: <0.1 kU/L
White Mulberry IgE: <0.1 kU/L

## 2022-03-06 MED ORDER — ARFORMOTEROL TARTRATE 15 MCG/2ML IN NEBU: 15.0000 ug | INHALATION_SOLUTION | Freq: Two times a day (BID) | RESPIRATORY_TRACT | 5 refills | Status: DC

## 2022-03-06 NOTE — Telephone Encounter (Signed)
PA was denied stating that medication needs to be filed under part B. Medication has been sent in with attached diagnosis code and instruction to file under Medicare Part B.

## 2022-06-22 ENCOUNTER — Encounter: Payer: Self-pay | Admitting: Family

## 2022-07-03 ENCOUNTER — Encounter: Payer: Self-pay | Admitting: Gastroenterology

## 2022-07-04 ENCOUNTER — Ambulatory Visit: Payer: Medicare HMO | Admitting: Allergy and Immunology

## 2022-07-21 ENCOUNTER — Other Ambulatory Visit: Payer: Self-pay | Admitting: Allergy and Immunology

## 2022-08-16 ENCOUNTER — Encounter: Payer: Self-pay | Admitting: Gastroenterology

## 2022-08-16 ENCOUNTER — Ambulatory Visit (INDEPENDENT_AMBULATORY_CARE_PROVIDER_SITE_OTHER): Payer: Medicare HMO | Admitting: Gastroenterology

## 2022-08-16 VITALS — BP 126/68 | HR 89 | Ht 62.0 in | Wt 242.1 lb

## 2022-08-16 DIAGNOSIS — R1013 Epigastric pain: Secondary | ICD-10-CM | POA: Diagnosis not present

## 2022-08-16 DIAGNOSIS — K219 Gastro-esophageal reflux disease without esophagitis: Secondary | ICD-10-CM | POA: Diagnosis not present

## 2022-08-16 DIAGNOSIS — K5909 Other constipation: Secondary | ICD-10-CM

## 2022-08-16 DIAGNOSIS — K8020 Calculus of gallbladder without cholecystitis without obstruction: Secondary | ICD-10-CM

## 2022-08-16 NOTE — Progress Notes (Addendum)
[facility] Consult Note:  History: Mia Meyer 08/16/2022  Referring provider: Judd Lien, PA-C  Reason for consult/chief complaint: Gastroesophageal Reflux (Having some reflux, causing vomiting. Having gallbladder issues. Inconsistent episodes. No change in BM.)   Subjective  HPI:  Mia Meyer was here with her mother today for multiple digestive issues.  Office note from primary care visit on 06/23/2022 with Irving Copas, PA was reviewed.  Multiple digestive and other issues reviewed at that visit.  Patient's mother was requesting referral to GI specialist for gallstones, though without reported abdominal pain, diarrhea nausea or vomiting.  She does have chronic constipation for which a stool softener was recommended.  Patient has a chronic cough, allergic issues and needed referral for right arm and leg braces for her CP. GERD diagnosed treated with Pepcid in addition to the twice daily omeprazole already on.  It was difficult to get a clear description of symptoms and reason for visit today, there was some tangential aspect of the history.  Her mother Mia Meyer assists with the history. She describes years of intermittent abdominal pain that is subacute onset, starts in the left upper quadrant and is of a twisting or cramping nature, then often radiating across the upper abdomen.  It does not have associated nausea vomiting or change in bowel habits with the episodes.  She tends toward constipation and has been on iron for microcytic anemia.  Previously took MiraLAX with improvement, that was stopped when insurance would no longer cover it.  The other main issue appears to be a question of reflux and whether or not that is contributing to her chronic cough.  See below, she has been evaluated by allergy for asthma and asthmatic component to cough. Mia Meyer says that her daughter was diagnosed with reflux in childhood and had some previous workup, though no records of that  available.  She has been on acid suppression for many years, and has generally good control of heartburn or regurgitation as near as I can tell, but she has belching for which she will take additional H2 blocker as prescribed.  ROS:  Review of Systems  Constitutional:  Negative for appetite change and unexpected weight change.  HENT:  Negative for mouth sores and voice change.   Eyes:  Negative for pain and redness.  Respiratory:  Positive for cough. Negative for shortness of breath.   Cardiovascular:  Negative for chest pain and palpitations.  Genitourinary:  Negative for dysuria and hematuria.  Musculoskeletal:  Negative for arthralgias and myalgias.       Right-sided weakness from CP -fully ambulatory  Skin:  Negative for pallor and rash.  Allergic/Immunologic: Positive for environmental allergies.  Neurological:  Negative for weakness and headaches.  Hematological:  Negative for adenopathy.     Past Medical History: Past Medical History:  Diagnosis Date   Allergy    mold allergy   Asthma    Cerebral palsy (HCC)    Common migraine 11/27/2017   GERD (gastroesophageal reflux disease)    Neonatal stroke [facility])    This patient has been seen by allergy consultant [facility] May 2022 for asthma and allergic rhinitis, having previously been evaluated there in 2021.  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  Her mother says that shunt is  now connected to "her heart".  Family History: Family History  Problem Relation Age of Onset   Migraines Mother    Anxiety disorder Mother    Bipolar disorder Neg Hx    Schizophrenia Neg Hx    Depression Neg Hx    ADD / ADHD Neg Hx    Autism Neg Hx    Seizures Neg Hx     Social History: Social History   Socioeconomic  History   Marital status: Single    Spouse name: Not on file   Number of children: 0   Years of education: 12   Highest education level: Not on file  Occupational History   Not on file  Tobacco Use   Smoking status: Never   Smokeless tobacco: Never  Vaping Use   Vaping Use: Never used  Substance and Sexual Activity   Alcohol use: No   Drug use: No   Sexual activity: Never    Birth control/protection: Pill  Other Topics Concern   Not on file  Social History Narrative   Lives w/ mother and sister   Caffeine use: 1-2 times per day    Left handed   Vernell Morgans is in the 12 th grade at [employer]; doing well.    Social Determinants of Health   Financial Resource Strain: Not on file  Food Insecurity: Not on file  Transportation Needs: Not on file  Physical Activity: Not on file  Stress: Not on file  Social Connections: Not on file    Allergies: Allergies  Allergen Reactions   Caffeine Other (See Comments)    Pt lethargic with it    Oxycodone Itching    Mom states it's this particular formulation. Per Mother- Only Oxycodone from [facility] is okay. Mom states it's this particular formulation. Per Mother- Only Oxycodone from [facility] is okay.    Molds & Smuts Swelling    Cough.    Other     Seasonal Allergies      Outpatient Meds: Current Outpatient Medications  Medication Sig Dispense Refill   acetaminophen (TYLENOL) 500 MG tablet Take by mouth.     albuterol (PROVENTIL) (2.5 MG/3ML) 0.083% nebulizer solution USE 1 VIAL VIA NEBULIZER EVERY 4 HOURS AS NEEDED FOR WHEEZING OR SHORTNESS OF BREATH 150 mL 1   amitriptyline (ELAVIL) 25 MG tablet TAKE 1 TABLET NIGHTLY. MAY START WITH 1/2 TABLET FOR A FEW DAYS     arformoterol (BROVANA) 15 MCG/2ML NEBU Take 2 mLs (15 mcg total) by nebulization 2 (two) times daily. 120 mL 5   azelastine (ASTELIN) 0.1 % nasal spray PLACE 1 TO 2 SPRAYS IN EACH NOSTRIL TWICE DAILY FOR DRAINAGE 30 mL 5   beclomethasone (QVAR) 40 MCG/ACT inhaler Inhale 2 puffs  into the lungs 2 (two) times daily. 8.7 g 3   budesonide (PULMICORT) 0.5 MG/2ML nebulizer solution Take 2 mLs (0.5 mg total) by nebulization 2 (two) times daily. 120 mL 5   cetirizine (ZYRTEC) 10 MG tablet Take 1 tablet (10 mg total) by mouth 2 (two) times daily as needed for allergies (Can take an extra dose during flare ups.). 30 tablet 5   clindamycin-benzoyl peroxide (BENZACLIN) gel Apply topically.     cyclobenzaprine (FLEXERIL) 5 MG tablet Take 5 mg by mouth 2 (two) times daily. As directed.  0   diazepam (DIASTAT ACUDIAL) 10 MG GEL      Docusate Sodium 100 MG/10ML LIQD Take 1 capsule by mouth daily.     famotidine (PEPCID) 40 MG tablet Take  1 tablet (40 mg total) by mouth at bedtime. 30 tablet 5   Ferrous Sulfate 28 MG TABS Use 400 mg once daily     FLOVENT HFA 110 MCG/ACT inhaler Inhale 2 puffs into the lungs 2 (two) times daily. Use during asthma flares 12 g 5   fluticasone (FLONASE) 50 MCG/ACT nasal spray SHAKE LIQUID AND USE 1 SPRAY IN EACH NOSTRIL DAILY 16 g 5   GuanFACINE HCl 3 MG TB24 Take 3 mg by mouth daily.  0   ibuprofen (ADVIL,MOTRIN) 600 MG tablet Take 600 mg by mouth every 6 (six) hours as needed for pain.  0   iron polysaccharides (NIFEREX) 150 MG capsule Take 1 capsule by mouth daily.     levETIRAcetam (KEPPRA) 750 MG tablet      montelukast (SINGULAIR) 10 MG tablet Take 1 tablet (10 mg total) by mouth at bedtime. 30 tablet 5   Norethindrone Acetate-Ethinyl Estrad-FE ([name] 24 FE) 1-20 MG-MCG(24) tablet [name] 24 Fe 1 mg-20 mcg (24)/75 mg (4) tablet  TAKE 1 TABLET BY MOUTH EVERY DAY     omeprazole (PRILOSEC) 40 MG capsule Take 1 capsule (40 mg total) by mouth 2 (two) times daily. TAKE 1 CAPSULE(40 MG) BY MOUTH TWICE DAILY 60 capsule 5   ondansetron (ZOFRAN-ODT) 4 MG disintegrating tablet      polyethylene glycol powder (GLYCOLAX/MIRALAX) powder 1/2 TO 1 CAPFUL IN 8OZ BEVERAGE DAILY AS NEEDED  0   ranitidine (ZANTAC) 300 MG tablet Take 1 tablet (300 mg total) by mouth at  bedtime. 30 tablet 5   Respiratory Therapy Supplies (FULL KIT NEBULIZER SET) MISC 1 kit by Does not apply route See admin instructions. 1 each 0   SETLAKIN 0.15-0.03 MG tablet Take 1 tablet by mouth daily.  0   SUMAtriptan (IMITREX) 100 MG tablet Take 1 tablet at onset of migraine. Can repeat once in 2 hr if needed. No more than 2 tabs in 24 hr. 9 tablet 3   SYMBICORT 160-4.5 MCG/ACT inhaler Inhale 2 puffs into the lungs 2 (two) times daily. 10.2 g 5   triamcinolone cream (KENALOG) 0.1 % Apply 1 application topically 2 (two) times daily.  0   VENTOLIN HFA 108 (90 Base) MCG/ACT inhaler INHALE 2 PUFFS EVERY 4-6 HOURS AS NEEDED FOR COUGH/WHEEZING. 18 g 1   No current facility-administered medications for this visit.      ___________________________________________________________________ Objective   Exam:  BP 126/68   Pulse 89   Ht 5\' 2"  (1.575 m)   Wt 242 lb 2 oz (109.8 kg)   BMI 44.29 kg/m  Wt Readings from Last 3 Encounters:  08/16/22 242 lb 2 oz (109.8 kg)  02/28/22 234 lb 9.6 oz (106.4 kg)  02/06/22 234 lb (106.1 kg)    General: Flattened affect, not acutely ill-appearing. Eyes: sclera anicteric, no redness ENT: oral mucosa moist without lesions, no cervical or supraclavicular lymphadenopathy CNS shunt palpable in the left neck CV: Regular without appreciable murmur (soft heart sounds, no JVD, no peripheral edema Resp: clear to auscultation bilaterally, normal RR and effort noted GI: soft, obese, no tenderness, with active bowel sounds. No guarding or palpable organomegaly noted. Skin; warm and dry, no rash or jaundice noted Neuro: awake, alert and oriented x 3.  Right-sided weakness  Labs:    Hemoglobin 11.1 with low MCV June 2023  Most recent LFTs normal.  Radiologic Studies: Abdominal ultrasound done at [facility] June 2022 Procedure: July 2022 GALLBLADDER   Indication: abdominal pain, history of gallstones, R10.84 Generalized  abdominal pain   Comparison: Gallbladder  ultrasound 06/19/2020   Technique: Grayscale and color Doppler ultrasound of the right upper  quadrant was performed.   Findings:   Gallbladder:      Intraluminal contents: Numerous shadowing gallstones within the  gallbladder, which obscures the gallbladder lumen.       Wall thickness: 3 mm. This is within normal limits.       Distention: Not abnormally distended.       Pericholecystic fluid: No pericholecystic fluid.       Sonographic Murphy's sign is absent.   Bile Ducts: No intrahepatic or extrahepatic ductal dilation       Common bile duct diameter: 3 mm, within normal limits.   Liver: Normal echogenicity. No focal mass.   Pancreas: Not well evaluated due to overlying bowel gas.   Kidneys: Right kidney measures 9.9 cm. No hydronephrosis.   Fluid: None.   Impression:  Numerous gallstones, which obscure the gallbladder lumen. No evidence of  cholecystitis.   Electronically Reviewed by:  Dessa Phi, MD, [facility]  Electronically Reviewed on:  01/13/2021 3:29 PM   I have reviewed the images and concur with the above findings.   Electronically Signed by:  Zigmund Daniel, MD  Electronically Signed on:  01/13/2021 3:33 PM     Assessment: Encounter Diagnoses  Name Primary?   Epigastric pain Yes   Gastroesophageal reflux disease, unspecified whether esophagitis present    Chronic constipation    Gallstones    Chronic constipation, perhaps exacerbated by iron therapy. The reported previous improvement on MiraLAX, I recommended using over-the-counter form that previously worked well.  I would prefer not to add additional prescription medicines to her polypharmacy if possible.  Upper abdominal pain that is difficult to characterize.  It is episodic and reported as a squeezing discomfort, so possible biliary colic, but it is description and location are not entirely typical for that.  Gallstones found on 2022 ultrasound. Consider H. pylori, peptic ulcer, the episodic nature  seems to make those less likely.  Reported reflux with some heartburn and regurgitation, on high-dose PPI for years, then H2 blocker was added.  The breakthrough symptoms of belching and intermittent regurgitation are not necessarily going to improve with more acid suppression.  We discussed the nature of GERD and the utility but also limitation of acid suppression therapy as well as the multiple contributing factors to reflux. I also discussed that even with extensive testing, there is almost invariably uncertainty and knowing the extent to which any demonstrable reflux may be contributing to the patient's chronic cough.  Upper endoscopy recommended.  Procedure described in detail along with risk and benefits and they were agreeable.  The benefits and risks of the planned procedure were described in detail with the patient or (when appropriate) their health care proxy.  Risks were outlined as including, but not limited to, bleeding, infection, perforation, adverse medication reaction leading to cardiac or pulmonary decompensation, pancreatitis (if ERCP).  The limitation of incomplete mucosal visualization was also discussed.  No guarantees or warranties were given.    Thank you for the courtesy of this consult.  Please call me with any questions or concerns. (Complex medical decision making, extensive chart review and face-to-face evaluation required)  Nelida Meuse III  CC: Referring provider noted above

## 2022-08-16 NOTE — Patient Instructions (Addendum)
   If you are age 25 or older, your body mass index should be between 23-30. Your Body mass index is 44.29 kg/m. If this is out of the aforementioned range listed, please consider follow up with your Primary Care Provider.  If you are age 50 or younger, your body mass index should be between 19-25. Your Body mass index is 44.29 kg/m. If this is out of the aformentioned range listed, please consider follow up with your Primary Care Provider.   You have been scheduled for an endoscopy. Please follow written instructions given to you at your visit today. If you use inhalers (even only as needed), please bring them with you on the day of your procedure.  The [facility] providers would like to encourage you to use [facility] to communicate with providers for non-urgent requests or questions.  Due to long hold times on the telephone, sending your provider a message by [facility] may be a faster and more efficient way to get a response.  Please allow 48 business hours for a response.  Please remember that this is for non-urgent requests.   It was a pleasure to see you today!  Thank you for trusting me with your gastrointestinal care!

## 2022-08-17 ENCOUNTER — Ambulatory Visit (AMBULATORY_SURGERY_CENTER): Payer: Medicare HMO | Admitting: Gastroenterology

## 2022-08-17 ENCOUNTER — Encounter: Payer: Self-pay | Admitting: Gastroenterology

## 2022-08-17 VITALS — BP 115/77 | HR 69 | Temp 97.8°F | Resp 18 | Ht 62.0 in | Wt 242.0 lb

## 2022-08-17 DIAGNOSIS — R053 Chronic cough: Secondary | ICD-10-CM | POA: Diagnosis not present

## 2022-08-17 DIAGNOSIS — R1013 Epigastric pain: Secondary | ICD-10-CM

## 2022-08-17 MED ORDER — SODIUM CHLORIDE 0.9 % IV SOLN: 500.0000 mL | Freq: Once | INTRAVENOUS | Status: AC

## 2022-08-17 NOTE — Patient Instructions (Signed)
Resume previous diet and medications. (STOP Famotidine) Awaiting pathology results Return to my office at appointment to be scheduled. (Re-evaluate abdominal pain, treat Chronic Constipation) Focus on diet and lifestyle antireflux measures.  YOU HAD AN ENDOSCOPIC PROCEDURE TODAY AT [facility]:   Refer to the procedure report that was given to you for any specific questions about what was found during the examination.  If the procedure report does not answer your questions, please call your gastroenterologist to clarify.  If you requested that your care partner not be given the details of your procedure findings, then the procedure report has been included in a sealed envelope for you to review at your convenience later.  YOU SHOULD EXPECT: Some feelings of bloating in the abdomen. Passage of more gas than usual.  Walking can help get rid of the air that was put into your GI tract during the procedure and reduce the bloating. If you had a lower endoscopy (such as a colonoscopy or flexible sigmoidoscopy) you may notice spotting of blood in your stool or on the toilet paper. If you underwent a bowel prep for your procedure, you may not have a normal bowel movement for a few days.  Please Note:  You might notice some irritation and congestion in your nose or some drainage.  This is from the oxygen used during your procedure.  There is no need for concern and it should clear up in a day or so.  SYMPTOMS TO REPORT IMMEDIATELY:  Following upper endoscopy (EGD)  Vomiting of blood or coffee ground material  New chest pain or pain under the shoulder blades  Painful or persistently difficult swallowing  New shortness of breath  Fever of 100F or higher  Black, tarry-looking stools  For urgent or emergent issues, a gastroenterologist can be reached at any hour by calling [phone]. Do not use MyChart messaging for urgent concerns.    DIET:  We do recommend a small meal at first,  but then you may proceed to your regular diet.  Drink plenty of fluids but you should avoid alcoholic beverages for 24 hours.  ACTIVITY:  You should plan to take it easy for the rest of today and you should NOT DRIVE or use heavy machinery until tomorrow (because of the sedation medicines used during the test).    FOLLOW UP: Our staff will call the number listed on your records the next business day following your procedure.  We will call around 7:15- 8:00 am to check on you and address any questions or concerns that you may have regarding the information given to you following your procedure. If we do not reach you, we will leave a message.     If any biopsies were taken you will be contacted by phone or by letter within the next 1-3 weeks.  Please call us at [phone] if you have not heard about the biopsies in 3 weeks.    SIGNATURES/CONFIDENTIALITY: You and/or your care partner have signed paperwork which will be entered into your electronic medical record.  These signatures attest to the fact that that the information above on your After Visit Summary has been reviewed and is understood.  Full responsibility of the confidentiality of this discharge information lies with you and/or your care-partner.

## 2022-08-17 NOTE — Progress Notes (Signed)
No changes to clinical history since GI office visit on 08/16/22.  The patient is appropriate for an endoscopic procedure in the ambulatory setting.  - Wilfrid Lund, MD

## 2022-08-17 NOTE — Progress Notes (Signed)
Report given to PACU, vss 

## 2022-08-17 NOTE — Progress Notes (Signed)
Called to room to assist during endoscopic procedure.  Patient ID and intended procedure confirmed with present staff. Received instructions for my participation in the procedure from the performing physician.  

## 2022-08-17 NOTE — Op Note (Signed)
[facility] Patient Name: Angelina Venard Procedure Date: 08/17/2022 11:49 AM MRN: [id number] Endoscopist: Mallie Mussel L. Loletha Carrow , MD, [id number] Age: 27 Referring MD:  Date of Birth: 1995-11-01 Gender: Female Account #: [id number] Procedure:                Upper GI endoscopy Indications:              Upper abdominal pain, Chronic cough (clinical                            concern from patient's other providers that GERD                            may be contributing to chronic cough in the setting                            of asthma and allergies)                           Clinical details in office consult note of 08/16/2022 Medicines:                Monitored Anesthesia Care Procedure:                Pre-Anesthesia Assessment:                           - Prior to the procedure, a History and Physical                            was performed, and patient medications and                            allergies were reviewed. The patient's tolerance of                            previous anesthesia was also reviewed. The risks                            and benefits of the procedure and the sedation                            options and risks were discussed with the patient.                            All questions were answered, and informed consent                            was obtained. Prior Anticoagulants: The patient has                            taken no anticoagulant or antiplatelet agents. ASA                            Grade Assessment: III - A patient with severe  systemic disease. After reviewing the risks and                            benefits, the patient was deemed in satisfactory                            condition to undergo the procedure.                           After obtaining informed consent, the endoscope was                            passed under direct vision. Throughout the                            procedure, the patient's  blood pressure, pulse, and                            oxygen saturations were monitored continuously. The                            Endoscope was introduced through the mouth, and                            advanced to the second part of duodenum. The upper                            GI endoscopy was accomplished without difficulty.                            The patient tolerated the procedure well. Scope In: Scope Out: Findings:                 The larynx was normal.                           The esophagus was normal.                           Normal mucosa was found in the entire examined                            stomach. Several biopsies were obtained in the                            gastric body and in the gastric antrum with cold                            forceps for histology.                           The cardia and gastric fundus were normal on                            retroflexion.  The examined duodenum was normal. Complications:            No immediate complications. Estimated Blood Loss:     Estimated blood loss was minimal. Impression:               - Normal larynx.                           - Normal esophagus.                           - Normal mucosa was found in the entire stomach.                           - Normal examined duodenum.                           - Several biopsies were obtained in the gastric                            body and in the gastric antrum.                           There is no visible evidence of uncontrolled reflux                            to suggest that GERD is a contributing factor to                            this patient's allergic and asthmatic cough.                           This patient tends to take H2 blocker as needed for                            belching or regurgitation. The symptoms are not                            likely to respond to escalating doses of acid                             suppression medication. (See below) Recommendation:           - Patient has a contact number available for                            emergencies. The signs and symptoms of potential                            delayed complications were discussed with the                            patient. Return to normal activities tomorrow.                            Written discharge instructions  were provided to the                            patient.                           - Resume previous diet.                           - Continue most present medications. Stop                            famotidine.                           - Await pathology results.                           - Return to my office at appointment to be                            scheduled. (Reevaluate abdominal pain, treat                            chronic constipation)                           - Focus on diet and lifestyle antireflux measures.                            These are at least as important as acid reducing                            medicines to help control this condition.                           Low carbohydrate diet and weight loss, if feasible,                            would most likely improve this patient's reflux                            symptoms as well. Laury Huizar L. Loletha Carrow, MD 08/17/2022 12:22:25 PM This report has been signed electronically.

## 2022-08-17 NOTE — Progress Notes (Signed)
1202 Robinul 0.1 mg IV given due large amount of secretions upon assessment.  MD made aware, vss

## 2022-08-18 ENCOUNTER — Telehealth: Payer: Self-pay

## 2022-08-18 NOTE — Telephone Encounter (Signed)
Post procedure follow up call, no answer 

## 2022-08-18 NOTE — Telephone Encounter (Signed)
Per 08/17/22 procedure report - Return to my office at appt to be scheduled  (Reevaluate abdominal pain, treat chronic constipation).  Patient has been scheduled for a f/u appt with Dr. Loletha Carrow on Friday, 09/15/22 at 9:00 am.  Lm on vm for patient to return call.

## 2022-08-21 NOTE — Telephone Encounter (Signed)
Noted  

## 2022-08-21 NOTE — Telephone Encounter (Signed)
Patient returned call, advised of appointment time and date.

## 2022-08-25 ENCOUNTER — Other Ambulatory Visit: Payer: Self-pay

## 2022-08-25 DIAGNOSIS — K8020 Calculus of gallbladder without cholecystitis without obstruction: Secondary | ICD-10-CM

## 2022-08-25 DIAGNOSIS — R1013 Epigastric pain: Secondary | ICD-10-CM

## 2022-08-27 ENCOUNTER — Other Ambulatory Visit: Payer: Self-pay | Admitting: Allergy and Immunology

## 2022-08-28 ENCOUNTER — Other Ambulatory Visit: Payer: Self-pay | Admitting: *Deleted

## 2022-08-28 ENCOUNTER — Other Ambulatory Visit: Payer: Self-pay | Admitting: Allergy and Immunology

## 2022-08-28 MED ORDER — FLUTICASONE PROPIONATE HFA 110 MCG/ACT IN AERO: INHALATION_SPRAY | RESPIRATORY_TRACT | 0 refills | Status: DC

## 2022-09-08 ENCOUNTER — Ambulatory Visit (HOSPITAL_COMMUNITY): Payer: Medicare HMO

## 2022-09-12 ENCOUNTER — Ambulatory Visit (HOSPITAL_COMMUNITY)
Admission: RE | Admit: 2022-09-12 | Discharge: 2022-09-12 | Disposition: A | Discharge status: Home / Self Care | Payer: Medicare HMO | Source: Ambulatory Visit | Attending: Gastroenterology | Admitting: Gastroenterology

## 2022-09-12 DIAGNOSIS — K8020 Calculus of gallbladder without cholecystitis without obstruction: Secondary | ICD-10-CM | POA: Diagnosis present

## 2022-09-12 DIAGNOSIS — R1013 Epigastric pain: Secondary | ICD-10-CM | POA: Diagnosis present

## 2022-09-15 ENCOUNTER — Telehealth: Payer: Self-pay

## 2022-09-15 ENCOUNTER — Encounter: Payer: Self-pay | Admitting: Gastroenterology

## 2022-09-15 ENCOUNTER — Ambulatory Visit (INDEPENDENT_AMBULATORY_CARE_PROVIDER_SITE_OTHER): Payer: Medicare HMO | Admitting: Gastroenterology

## 2022-09-15 VITALS — BP 106/68 | HR 92 | Ht 62.0 in | Wt 249.4 lb

## 2022-09-15 DIAGNOSIS — D508 Other iron deficiency anemias: Secondary | ICD-10-CM | POA: Diagnosis not present

## 2022-09-15 DIAGNOSIS — K5909 Other constipation: Secondary | ICD-10-CM | POA: Diagnosis not present

## 2022-09-15 DIAGNOSIS — K8020 Calculus of gallbladder without cholecystitis without obstruction: Secondary | ICD-10-CM | POA: Diagnosis not present

## 2022-09-15 DIAGNOSIS — R1013 Epigastric pain: Secondary | ICD-10-CM

## 2022-09-15 MED ORDER — POLYETHYLENE GLYCOL 3350 17 GM/SCOOP PO POWD: ORAL | 0 refills | Status: AC

## 2022-09-15 NOTE — Progress Notes (Signed)
[facility] Progress Note  Chief Complaint: Abdominal pain, gallstones, constipation  Subjective  History: Summary of pertinent GI issues: Office consult 08/16/2022 for chronic constipation (perhaps worsened by oral iron for IDA), reflux symptoms with question of that contributing to chronic cough, upper abdominal pain with a history of gallstones. Reflux symptoms are difficult to characterize, and patient was taking as needed H2 blockers for belching and regurgitation. EGD 08/17/2022 normal exam, gastric biopsies negative for H. pylori.  Gallbladder ultrasound with results below. ________________________  Mia Meyer is here with her mother today.  Symptoms remain about the same, with episodes of severe epigastric pain that feels tight and cramping and radiates around to the left and then the back. Still has constipation with a BM about every 3 days.  Has not been taking MiraLAX regularly because her mother is working through some prescription insurance coverage.  (Several samples were given today). She is still on iron tablets, and they are waiting to hear back from primary care about a follow-up appointment.  ROS: Cardiovascular:  no chest pain Respiratory: no dyspnea Right-sided weakness (chronic, CP) Feels "cold" sometimes Remainder systems negative except as above The patient's Past Medical, Family and Social History were reviewed and are on file in the EMR. Past Medical History:  Diagnosis Date   Allergy    mold allergy   Asthma    Cerebral palsy ([place])    Common migraine 11/27/2017   GERD (gastroesophageal reflux disease)    Neonatal stroke ([place])     Objective:  Med list reviewed  Current Outpatient Medications:    acetaminophen (TYLENOL) 500 MG tablet, Take by mouth., Disp: , Rfl:    albuterol (PROVENTIL) (2.5 MG/3ML) 0.083% nebulizer solution, USE 1 VIAL VIA NEBULIZER EVERY 4 HOURS AS NEEDED FOR WHEEZING OR SHORTNESS OF BREATH, Disp: 150 mL, Rfl: 1   amitriptyline  (ELAVIL) 25 MG tablet, TAKE 1 TABLET NIGHTLY. MAY START WITH 1/2 TABLET FOR A FEW DAYS, Disp: , Rfl:    arformoterol (BROVANA) 15 MCG/2ML NEBU, Take 2 mLs (15 mcg total) by nebulization 2 (two) times daily., Disp: 120 mL, Rfl: 5   azelastine (ASTELIN) 0.1 % nasal spray, PLACE 1 TO 2 SPRAYS IN EACH NOSTRIL TWICE DAILY FOR DRAINAGE, Disp: 30 mL, Rfl: 5   beclomethasone (QVAR) 40 MCG/ACT inhaler, Inhale 2 puffs into the lungs 2 (two) times daily., Disp: 8.7 g, Rfl: 3   budesonide (PULMICORT) 0.5 MG/2ML nebulizer solution, Take 2 mLs (0.5 mg total) by nebulization 2 (two) times daily., Disp: 120 mL, Rfl: 5   clindamycin-benzoyl peroxide (BENZACLIN) gel, Apply topically., Disp: , Rfl:    cyclobenzaprine (FLEXERIL) 5 MG tablet, Take 5 mg by mouth 2 (two) times daily. As directed., Disp: , Rfl: 0   diazepam (DIASTAT ACUDIAL) 10 MG GEL, , Disp: , Rfl:    Docusate Sodium 100 MG/10ML LIQD, Take 1 capsule by mouth daily., Disp: , Rfl:    famotidine (PEPCID) 40 MG tablet, Take 1 tablet (40 mg total) by mouth at bedtime., Disp: 30 tablet, Rfl: 5   Ferrous Sulfate 28 MG TABS, Use 400 mg once daily, Disp: , Rfl:    fluticasone (FLONASE) 50 MCG/ACT nasal spray, SHAKE LIQUID AND USE 1 SPRAY IN EACH NOSTRIL DAILY, Disp: 16 g, Rfl: 5   fluticasone (FLOVENT HFA) 110 MCG/ACT inhaler, INHALE 2 PUFFS BY MOUTH TWICE DAILY USE DURING ASTHMA FLARES, Disp: 12 g, Rfl: 0   GuanFACINE HCl 3 MG TB24, Take 3 mg by mouth daily., Disp: ,  Rfl: 0   ibuprofen (ADVIL,MOTRIN) 600 MG tablet, Take 600 mg by mouth every 6 (six) hours as needed for pain., Disp: , Rfl: 0   iron polysaccharides (NIFEREX) 150 MG capsule, Take 1 capsule by mouth daily., Disp: , Rfl:    KURVELO 0.15-30 MG-MCG tablet, Take 1 tablet by mouth daily., Disp: , Rfl:    levETIRAcetam (KEPPRA XR) 750 MG 24 hr tablet, Take 750 mg by mouth every 12 (twelve) hours., Disp: , Rfl:    montelukast (SINGULAIR) 10 MG tablet, Take 1 tablet (10 mg total) by mouth at bedtime.,  Disp: 30 tablet, Rfl: 5   Norethindrone Acetate-Ethinyl Estrad-FE ([name] 24 FE) 1-20 MG-MCG(24) tablet, [name] 24 Fe 1 mg-20 mcg (24)/75 mg (4) tablet  TAKE 1 TABLET BY MOUTH EVERY DAY, Disp: , Rfl:    omeprazole (PRILOSEC) 40 MG capsule, Take 1 capsule (40 mg total) by mouth 2 (two) times daily. TAKE 1 CAPSULE(40 MG) BY MOUTH TWICE DAILY, Disp: 60 capsule, Rfl: 5   ondansetron (ZOFRAN-ODT) 4 MG disintegrating tablet, , Disp: , Rfl:    polyethylene glycol powder (GLYCOLAX/MIRALAX) powder, , Disp: , Rfl: 0   Respiratory Therapy Supplies (FULL KIT NEBULIZER SET) MISC, 1 kit by Does not apply route See admin instructions., Disp: 1 each, Rfl: 0   SUMAtriptan (IMITREX) 100 MG tablet, Take 1 tablet at onset of migraine. Can repeat once in 2 hr if needed. No more than 2 tabs in 24 hr., Disp: 9 tablet, Rfl: 3   SYMBICORT 160-4.5 MCG/ACT inhaler, Inhale 2 puffs into the lungs 2 (two) times daily., Disp: 10.2 g, Rfl: 5   VENTOLIN HFA 108 (90 Base) MCG/ACT inhaler, INHALE 2 PUFFS EVERY 4-6 HOURS AS NEEDED FOR COUGH/WHEEZING., Disp: 18 g, Rfl: 1  Current Facility-Administered Medications:    0.9 %  sodium chloride infusion, 500 mL, Intravenous, Once, Danis, Estill Cotta III, MD   Vital signs in last 24 hrs: Vitals:   09/15/22 0902  BP: 106/68  Pulse: 92   Wt Readings from Last 3 Encounters:  09/15/22 249 lb 6 oz (113.1 kg)  08/17/22 242 lb (109.8 kg)  08/16/22 242 lb 2 oz (109.8 kg)    Physical Exam  HEENT: sclera anicteric, oral mucosa moist without lesions Neck: supple, no thyromegaly, JVD or lymphadenopathy Cardiac: Regular without appreciable murmur,  no peripheral edema Pulm: clear to auscultation bilaterally, normal RR and effort noted Abdomen: soft, no tenderness, with active bowel sounds.  Exam limited by obesity/body habitus Skin; warm and dry, no jaundice or rash  Labs:   ___________________________________________ Radiologic studies:  CLINICAL DATA:  Epigastric pain.    EXAM: ULTRASOUND ABDOMEN LIMITED RIGHT UPPER QUADRANT   COMPARISON:  None Available.   FINDINGS: Gallbladder:   Multiple gallstones are noted in the gallbladder. No wall thickening visualized. No sonographic Murphy sign noted by sonographer.   Common bile duct:   Diameter: 2.3 mm   Liver:   No focal lesion identified. Within normal limits in parenchymal echogenicity. Portal vein is patent on color Doppler imaging with normal direction of blood flow towards the liver.   Other: None.   IMPRESSION: Cholelithiasis without sonographic evidence of acute cholecystitis.     Electronically Signed   By: Abelardo Diesel M.D.   On: 09/12/2022 09:01   ____________________________________________ Other:   _____________________________________________ Assessment & Plan  Assessment: Encounter Diagnoses  Name Primary?   Chronic constipation Yes   Epigastric pain    Gallstones    Other iron deficiency anemia  Chronic constipation, functional and probably exacerbated by oral iron.  Gallstones, still difficult to tell if these are the cause of the episodic abdominal pain, but there is no other apparent cause on upper endoscopy.  No recent cross-sectional imaging. The fact that the pain radiates around the left is certainly atypical for gallstones, but the severity and episodic nature of it still make this a possibility.  Iron deficiency anemia, presumably menstrual blood loss.  Oral iron is probably worsening constipation so I recommended they stop it.  When I see primary care soon, have CBC and iron levels checked.  If iron deficient, she will be better off getting IV iron. If microcytic anemia and iron levels normal, consider testing for thalassemia and hematology evaluation.  Plan: Redouble efforts to get sufficient dietary fiber, fluid intake, and take MiraLAX once daily.  Her mother and I agree we would prefer not to start an additional prescription medication if possible  with her current polypharmacy.  However, if no results with above we can consider Linzess.  Management of IDA as above  Refer to Dr. Michaelle Birks at [place] for evaluation of abdominal pain and gallstones and consideration of cholecystectomy.  30 minutes were spent on this encounter (including chart review, history/exam, counseling/coordination of care, and documentation) > 50% of that time was spent on counseling and coordination of care.   Nelida Meuse III

## 2022-09-15 NOTE — Telephone Encounter (Signed)
Referral has been faxed to [facility]. Will await appointment info

## 2022-09-15 NOTE — Patient Instructions (Signed)
_______________________________________________________  If your blood pressure at your visit was 140/90 or greater, please contact your primary care physician to follow up on this.  _______________________________________________________  If you are age 25 or older, your body mass index should be between 23-30. Your Body mass index is 45.61 kg/m. If this is out of the aforementioned range listed, please consider follow up with your Primary Care Provider.  If you are age 87 or younger, your body mass index should be between 19-25. Your Body mass index is 45.61 kg/m. If this is out of the aformentioned range listed, please consider follow up with your Primary Care Provider.   ________________________________________________________  The [facility] providers would like to encourage you to use [facility] to communicate with providers for non-urgent requests or questions.  Due to long hold times on the telephone, sending your provider a message by [facility] may be a faster and more efficient way to get a response.  Please allow 48 business hours for a response.  Please remember that this is for non-urgent requests.  _______________________________________________________  We will send your records to [facility] is located at [address]. Should you need to reschedule your appointment, please contact them at [phone].  It was a pleasure to see you today!  Thank you for trusting me with your gastrointestinal care!

## 2022-10-27 NOTE — Telephone Encounter (Signed)
Patient was seen 10-05-22

## 2023-07-02 ENCOUNTER — Ambulatory Visit (INDEPENDENT_AMBULATORY_CARE_PROVIDER_SITE_OTHER): Payer: Medicare HMO | Admitting: Family Medicine

## 2023-07-02 ENCOUNTER — Other Ambulatory Visit: Payer: Self-pay

## 2023-07-02 ENCOUNTER — Telehealth: Payer: Self-pay | Admitting: Allergy

## 2023-07-02 ENCOUNTER — Encounter: Payer: Self-pay | Admitting: Family Medicine

## 2023-07-02 VITALS — BP 110/60 | HR 104 | Temp 97.3°F | Resp 16 | Ht 61.81 in | Wt 249.2 lb

## 2023-07-02 DIAGNOSIS — K219 Gastro-esophageal reflux disease without esophagitis: Secondary | ICD-10-CM

## 2023-07-02 DIAGNOSIS — J3089 Other allergic rhinitis: Secondary | ICD-10-CM

## 2023-07-02 DIAGNOSIS — J4540 Moderate persistent asthma, uncomplicated: Secondary | ICD-10-CM | POA: Diagnosis not present

## 2023-07-02 MED ORDER — LEVALBUTEROL TARTRATE 45 MCG/ACT IN AERO
4.0000 | INHALATION_SPRAY | Freq: Once | RESPIRATORY_TRACT | Status: AC
  Administered 2023-07-02: 4 via RESPIRATORY_TRACT

## 2023-07-02 NOTE — Telephone Encounter (Signed)
Please call patient.  Mom called regarding a Rx refill but hasn't been seen  in over 1 year.  Needs OV.

## 2023-07-02 NOTE — Progress Notes (Signed)
[address] [place] [address] Dept: [phone]  FOLLOW UP NOTE  Patient ID: Mia Meyer, female    DOB: 1996-04-26  Age: 27 y.o. MRN: [id number] Date of Office Visit: 07/02/2023  Assessment  Chief Complaint: Follow-up (Congestion in the throat. Spitting out mucous. Started about a month ago. Getting better but still constant. ) and Medication Refill  HPI Mia Meyer is a 27 year old female who presents to the clinic for a follow up visit. She was last seen in this clinic on 02/28/2022 by Dr. Deeann Dowse for evaluation of asthma, allergic rhinitis, and reflux.  She is accompanied by her mother who assists with history.  At today's visit, she reports her asthma has been poorly controlled with symptoms including shortness of breath with activity which has been occurring for about 1 year, wheezing occurring in the daytime and at night for about 1 to 2 months, and nighttime cough producing clear mucus that has been occurring about 3 months.  She currently has several inhalers and nebulized medications to control her asthma.  She reports that she is currently using a nebulized medication twice a day.  I suspect this is albuterol as budesonide and a formoterol have not been picked up from the pharmacy over the last several months.  She reports that she has a Symbicort 160 inhaler which she uses as needed and uses albuterol inhaler if she is really out of breath.  She recently restarted montelukast with moderate relief of symptoms.  We discussed that mechanism of action and Symbicort, budesonide, Perforomist, and albuterol.  She reports that she wishes to continue with nebulized medications as these have been more effective than inhaled medications.  Allergic rhinitis is reported as moderately well-controlled with nasal congestion as the main symptom.  She also complains of clear rhinorrhea which has recently improved slightly, occasional sneezing, and frequent postnasal drainage.  She continues  an over-the-counter antihistamine daily and uses Flonase about 3 days a week with moderate relief of symptoms.  She is not currently using a nasal saline rinse.  Her last environmental allergy testing via lab was on 03/03/2022 was positive to dust mite and cockroach.  Reflux is reported as poorly controlled with heartburn abdominal pain occurring about once a week.  She continues omeprazole twice a day and continues to follow-up with her GI specialist, Dr. Myrtie Neither with [facility], as recommended.   Her current medications are listed in the chart.   Drug Allergies:  Allergies  Allergen Reactions   Caffeine Other (See Comments)    Pt lethargic with it, [facility] brand   Oxycodone Itching    Mom states it's this particular formulation. Per Mother- Only Oxycodone from [facility] is okay. Mom states it's this particular formulation. Per Mother- Only Oxycodone from [facility] is okay.    Molds & Smuts Swelling    Cough.    Other     Seasonal Allergies      Physical Exam: BP 110/60   Pulse (!) 104   Temp (!) 97.3 F (36.3 C) (Temporal)   Resp 16   Ht 5' 1.81" (1.57 m)   Wt 249 lb 3.2 oz (113 kg)   SpO2 97%   BMI 45.86 kg/m    Physical Exam Vitals reviewed.  Constitutional:      Appearance: Normal appearance.  HENT:     Head: Normocephalic and atraumatic.     Right Ear: Tympanic membrane normal.     Left Ear: Tympanic membrane normal.  Nose:     Comments: Bilateral naris slightly erythematous with thin clear nasal drainage noted.  Pharynx is slightly erythematous with no exudate.  Ears normal.  Eyes normal. Eyes:     Conjunctiva/sclera: Conjunctivae normal.  Cardiovascular:     Rate and Rhythm: Normal rate and regular rhythm.     Heart sounds: Normal heart sounds. No murmur heard. Pulmonary:     Effort: Pulmonary effort is normal.     Breath sounds: Normal breath sounds.     Comments: Lungs clear to auscultation Musculoskeletal:        General: Normal range of motion.     Cervical  back: Normal range of motion and neck supple.  Skin:    General: Skin is warm and dry.  Neurological:     Mental Status: She is alert and oriented to person, place, and time.  Psychiatric:        Mood and Affect: Mood normal.        Behavior: Behavior normal.        Thought Content: Thought content normal.        Judgment: Judgment normal.     Diagnostics: FVC 2.83 which is 93% of predicted value, FEV1 2.10 which is 79% of predicted value.  Spirometry indicates airway obstruction.  Postbronchodilator spirometry with 6.6% improvement in FEV1.  Assessment and Plan: 1. Not well controlled moderate persistent asthma   2. LPRD (laryngopharyngeal reflux disease)   3. Perennial allergic rhinitis     Meds ordered this encounter  Medications   levalbuterol (XOPENEX HFA) inhaler 4 puff    Patient Instructions   1. Continue to Treat inflammation:   A. Budesonide 0.5 mg - nebulize 2 times per day  B. Perforomist  2 ml - nebulize 2 times per day-Stop Symbicort  C. nasal fluticasone - 1 spray each nostril 2 times per day  D. montelukast 10 mg tablet 1 time per day  2. Continue to Treat reflux / LPR:   A. omeprazole 40 mg twice a day  B. Famotidine 40 mg once a day in evening  C. no caffeine or chocolate consumption  D. Replace throat clearing with swallowing/drinking maneuver  E. Continue to follow up with your GI provider as recommended  3. If needed:   A. ProAir HFA or albuterol nebulization  B. cetirizine 10 mg tablet 1 time per day  C.  OTC Mucinex DM  D.  OTC nasal saline  4.  Return to clinic in 4 weeks or earlier if there is a problem   Return in about 4 weeks (around 07/30/2023), or if symptoms worsen or fail to improve.    Thank you for the opportunity to care for this patient.  Please do not hesitate to contact me with questions.  Thermon Leyland, FNP [facility]

## 2023-07-02 NOTE — Patient Instructions (Addendum)
  1. Continue to Treat inflammation:   A. Budesonide 0.5 mg - nebulize 2 times per day  B. Perforomist  2 ml - nebulize 2 times per day-Stop Symbicort  C. nasal fluticasone - 1 spray each nostril 2 times per day  D. montelukast 10 mg tablet 1 time per day  2. Continue to Treat reflux / LPR:   A. omeprazole 40 mg twice a day  B. Famotidine 40 mg once a day in evening  C. no caffeine or chocolate consumption  D. Replace throat clearing with swallowing/drinking maneuver  E. Continue to follow up with your GI provider as recommended  3. If needed:   A. ProAir HFA or albuterol nebulization  B. cetirizine 10 mg tablet 1 time per day  C.  OTC Mucinex DM  D.  OTC nasal saline  4.  Return to clinic in 4 weeks or earlier if there is a problem

## 2023-07-05 ENCOUNTER — Other Ambulatory Visit: Payer: Self-pay | Admitting: Allergy and Immunology

## 2023-07-08 ENCOUNTER — Other Ambulatory Visit: Payer: Self-pay | Admitting: Allergy and Immunology

## 2023-07-20 ENCOUNTER — Telehealth: Payer: Self-pay | Admitting: Allergy and Immunology

## 2023-07-20 MED ORDER — FAMOTIDINE 40 MG PO TABS: 40.0000 mg | ORAL_TABLET | Freq: Every evening | ORAL | 5 refills | Status: DC

## 2023-07-20 MED ORDER — BUDESONIDE 0.5 MG/2ML IN SUSP: 0.5000 mg | Freq: Two times a day (BID) | RESPIRATORY_TRACT | 5 refills | Status: DC

## 2023-07-20 MED ORDER — FORMOTEROL FUMARATE 20 MCG/2ML IN NEBU: 20.0000 ug | INHALATION_SOLUTION | Freq: Two times a day (BID) | RESPIRATORY_TRACT | 1 refills | Status: DC

## 2023-07-20 MED ORDER — ALBUTEROL SULFATE (2.5 MG/3ML) 0.083% IN NEBU: 2.5000 mg | INHALATION_SOLUTION | RESPIRATORY_TRACT | 1 refills | Status: DC | PRN

## 2023-07-20 MED ORDER — MONTELUKAST SODIUM 10 MG PO TABS: 10.0000 mg | ORAL_TABLET | Freq: Every day | ORAL | 5 refills | Status: DC

## 2023-07-20 MED ORDER — OMEPRAZOLE 40 MG PO CPDR: 40.0000 mg | DELAYED_RELEASE_CAPSULE | Freq: Two times a day (BID) | ORAL | 5 refills | Status: DC

## 2023-07-20 MED ORDER — CETIRIZINE HCL 10 MG PO TABS: 10.0000 mg | ORAL_TABLET | Freq: Every day | ORAL | 5 refills | Status: DC

## 2023-07-20 MED ORDER — VENTOLIN HFA 108 (90 BASE) MCG/ACT IN AERS: 2.0000 | INHALATION_SPRAY | RESPIRATORY_TRACT | 1 refills | Status: DC | PRN

## 2023-07-20 NOTE — Telephone Encounter (Signed)
Medication has been sent to the requested pharmacy. Mother has been called and notified.

## 2023-07-20 NOTE — Telephone Encounter (Signed)
Patients mom called and stated that patient saw Thurston Hole on 11/18 for refill but no refills were called in to pharmacy. Please send in refills on all her medications. Albuterol inhaler, albuterol nebulizer solution, symbacort, fluticasone, and omeprazole 40mg  that she takes 2x a day. The pharmacy is [facility] on [address]. Moms call back number is [phone]

## 2023-10-02 ENCOUNTER — Ambulatory Visit (INDEPENDENT_AMBULATORY_CARE_PROVIDER_SITE_OTHER): Payer: Medicare HMO | Admitting: Allergy and Immunology

## 2023-10-02 VITALS — BP 122/70 | HR 106 | Temp 97.8°F | Resp 18 | Ht 61.61 in | Wt 251.4 lb

## 2023-10-02 DIAGNOSIS — K219 Gastro-esophageal reflux disease without esophagitis: Secondary | ICD-10-CM | POA: Diagnosis not present

## 2023-10-02 DIAGNOSIS — J3089 Other allergic rhinitis: Secondary | ICD-10-CM | POA: Diagnosis not present

## 2023-10-02 DIAGNOSIS — J4540 Moderate persistent asthma, uncomplicated: Secondary | ICD-10-CM | POA: Diagnosis not present

## 2023-10-02 MED ORDER — MONTELUKAST SODIUM 10 MG PO TABS: 10.0000 mg | ORAL_TABLET | Freq: Every day | ORAL | 5 refills | Status: DC

## 2023-10-02 MED ORDER — FORMOTEROL FUMARATE 20 MCG/2ML IN NEBU: 20.0000 ug | INHALATION_SOLUTION | Freq: Two times a day (BID) | RESPIRATORY_TRACT | 1 refills | Status: DC

## 2023-10-02 MED ORDER — OMEPRAZOLE 40 MG PO CPDR: 40.0000 mg | DELAYED_RELEASE_CAPSULE | Freq: Two times a day (BID) | ORAL | 5 refills | Status: DC

## 2023-10-02 MED ORDER — BUDESONIDE 0.5 MG/2ML IN SUSP: 0.5000 mg | Freq: Two times a day (BID) | RESPIRATORY_TRACT | 5 refills | Status: DC

## 2023-10-02 MED ORDER — ALBUTEROL SULFATE (2.5 MG/3ML) 0.083% IN NEBU: 2.5000 mg | INHALATION_SOLUTION | RESPIRATORY_TRACT | 1 refills | Status: DC | PRN

## 2023-10-02 MED ORDER — FAMOTIDINE 40 MG PO TABS: 40.0000 mg | ORAL_TABLET | Freq: Every evening | ORAL | 5 refills | Status: DC

## 2023-10-02 NOTE — Patient Instructions (Signed)
  1. Continue to Treat inflammation:   A. Budesonide 0.5 mg - nebulize 2 times per day  B. Perforomist  2 ml - nebulize 2 times per day   C. nasal fluticasone - 1 spray each nostril 2 times per day  D. montelukast 10 mg tablet 1 time per day  2. Continue to Treat reflux / LPR:   A. omeprazole 40 mg twice a day  B. Famotidine 40 mg once a day in evening  C. no caffeine or chocolate consumption  D. Replace throat clearing with swallowing/drinking maneuver  3. If needed:   A. ProAir HFA or albuterol nebulization  B. cetirizine 10 mg tablet 1 time per day  C.  OTC Mucinex DM  D.  OTC nasal saline  4. Increase Budesonide nebulization (NOT perforomist) to 4 times a day with flare up  5.  Return to clinic in 6 months or earlier if there is a problem  6. Influenza = Tamiflu. Covid = Paxlovid

## 2023-10-02 NOTE — Progress Notes (Unsigned)
[place] [facility] [place] [facility] [place] [facility] [place] [facility] [place]   Follow-up Note  Referring Provider: Barrington Ellison Primary Provider: Barrington Ellison Date of Office Visit: 10/02/2023  Subjective:   Mia Meyer (DOB: 1995-08-21) is a 28 y.o. female who [facility] in re-evaluation of the following:  HPI: Mia Meyer returns to this clinic in evaluation of asthma, allergic rhinitis, reflux induced respiratory disease with LPR.  I last saw her in this clinic 28 February 2022 and she visited with our nurse practitioner on 02 July 2023.  During her last visit she appeared to have a flareup of her asthma and it was most likely triggered by some type of viral respiratory tract infection and she was instructed to use budesonide and Perforomist nebulization to replace her Symbicort as to her technique for inhaling Symbicort was probably not very good.  She is done very well since that point in time and overall feels as though her asthma is under very good control and she rarely uses a short acting bronchodilator.  She does have a little bit of cough at nighttime on occasion averaging out to maybe once or twice a week.  She has had very little problems with her nose while using nasal fluticasone and montelukast.  Her reflux is out-of-control during her last visit and while using a combination of omeprazole and famotidine and eliminating all caffeine and chocolate consumption she is actually done better.  She does not believe that she has any problems with her reflux at this point.  Allergies as of 10/02/2023       Reactions   Caffeine Other (See Comments)   Pt lethargic with it, [facility] brand   Molds & Smuts Swelling   Cough.    Other    Seasonal Allergies    Oxycodone Itching   Mom states it's this particular formulation. Per Mother- Only Oxycodone from [facility] is okay. Mom states it's this particular formulation. Per Mother- Only Oxycodone from  [facility] is okay.        Medication List    amitriptyline 25 MG tablet Commonly known as: ELAVIL TAKE 1 TABLET NIGHTLY. MAY START WITH 1/2 TABLET FOR A FEW DAYS   arformoterol 15 MCG/2ML Nebu Commonly known as: BROVANA Take 2 mLs (15 mcg total) by nebulization 2 (two) times daily.   azelastine 0.1 % nasal spray Commonly known as: ASTELIN PLACE 1 TO 2 SPRAYS IN EACH NOSTRIL TWICE DAILY FOR DRAINAGE   beclomethasone 40 MCG/ACT inhaler Commonly known as: Qvar Inhale 2 puffs into the lungs 2 (two) times daily.   budesonide 0.5 MG/2ML nebulizer solution Commonly known as: Pulmicort Take 2 mLs (0.5 mg total) by nebulization 2 (two) times daily.   cetirizine 10 MG tablet Commonly known as: ZYRTEC Take 1 tablet (10 mg total) by mouth daily.   clindamycin-benzoyl peroxide gel Commonly known as: BENZACLIN Apply topically.   cyclobenzaprine 5 MG tablet Commonly known as: FLEXERIL Take 5 mg by mouth 2 (two) times daily. As directed.   diazepam 10 MG Gel Commonly known as: DIASTAT ACUDIAL   Docusate Sodium 100 MG/10ML Liqd Take 1 capsule by mouth daily.   famotidine 40 MG tablet Commonly known as: Pepcid Take 1 tablet (40 mg total) by mouth at bedtime.   fluticasone 110 MCG/ACT inhaler Commonly known as: Flovent HFA INHALE 2 PUFFS BY MOUTH TWICE DAILY USE DURING ASTHMA FLARES   fluticasone 50 MCG/ACT nasal spray Commonly known as: FLONASE  SHAKE LIQUID AND USE 1 SPRAY IN EACH NOSTRIL DAILY   formoterol 20 MCG/2ML nebulizer solution Commonly known as: Perforomist Take 2 mLs (20 mcg total) by nebulization 2 (two) times daily.   Full Kit Nebulizer Set Misc 1 kit by Does not apply route See admin instructions.   Mia Meyer 24 Fe 1-20 MG-MCG(24) tablet Generic drug: Norethindrone Acetate-Ethinyl Estrad-FE Mia Meyer 24 Fe 1 mg-20 mcg (24)/75 mg (4) tablet  TAKE 1 TABLET BY MOUTH EVERY DAY   ibuprofen 600 MG tablet Commonly known as: ADVIL Take 600 mg by mouth every 6 (six)  hours as needed for pain.   iron polysaccharides 150 MG capsule Commonly known as: NIFEREX Take 1 capsule by mouth daily.   Kurvelo 0.15-30 MG-MCG tablet Generic drug: levonorgestrel-ethinyl estradiol Take 1 tablet by mouth daily.   levETIRAcetam 750 MG 24 hr tablet Commonly known as: KEPPRA XR Take 750 mg by mouth every 12 (twelve) hours.   montelukast 10 MG tablet Commonly known as: SINGULAIR Take 1 tablet (10 mg total) by mouth at bedtime.   omeprazole 40 MG capsule Commonly known as: PRILOSEC Take 1 capsule (40 mg total) by mouth 2 (two) times daily. TAKE 1 CAPSULE(40 MG) BY MOUTH TWICE DAILY   ondansetron 4 MG disintegrating tablet Commonly known as: ZOFRAN-ODT   polyethylene glycol powder 17 GM/SCOOP powder Commonly known as: GLYCOLAX/MIRALAX 17g/scoop daily   Symbicort 160-4.5 MCG/ACT inhaler Generic drug: budesonide-formoterol Inhale 2 puffs into the lungs 2 (two) times daily.   Ventolin HFA 108 (90 Base) MCG/ACT inhaler Generic drug: albuterol Inhale 2 puffs into the lungs every 4 (four) hours as needed for wheezing or shortness of breath.   albuterol (2.5 MG/3ML) 0.083% nebulizer solution Commonly known as: PROVENTIL Take 3 mLs (2.5 mg total) by nebulization every 4 (four) hours as needed for wheezing or shortness of breath.    Past Medical History:  Diagnosis Date   Allergy    mold allergy   Asthma    Cerebral palsy (HCC)    Common migraine 11/27/2017   GERD (gastroesophageal reflux disease)    Neonatal stroke [facility])     Past Surgical History:  Procedure Laterality Date   SHUNT REPLACEMENT Left    Shunt replaced in 2002   TENDON RELEASE Right     Review of systems negative except as noted in HPI / PMHx or noted below:  Review of Systems  Constitutional: Negative.   HENT: Negative.    Eyes: Negative.   Respiratory: Negative.    Cardiovascular: Negative.   Gastrointestinal: Negative.   Genitourinary: Negative.   Musculoskeletal: Negative.    Skin: Negative.   Neurological: Negative.   Endo/Heme/Allergies: Negative.   Psychiatric/Behavioral: Negative.       Objective:   Vitals:   10/02/23 1055  BP: 122/70  Pulse: (!) 106  Resp: 18  Temp: 97.8 F (36.6 C)  SpO2: 98%   Height: 5' 1.61" (156.5 cm)  Weight: 251 lb 6.4 oz (114 kg)   Physical Exam Constitutional:      Appearance: She is not diaphoretic.  HENT:     Head: Normocephalic.     Right Ear: Tympanic membrane, ear canal and external ear normal.     Left Ear: Tympanic membrane, ear canal and external ear normal.     Nose: Nose normal. No mucosal edema or rhinorrhea.     Mouth/Throat:     Pharynx: Uvula midline. No oropharyngeal exudate.  Eyes:     Conjunctiva/sclera: Conjunctivae normal.  Neck:  Thyroid: No thyromegaly.     Trachea: Trachea normal. No tracheal tenderness or tracheal deviation.  Cardiovascular:     Rate and Rhythm: Normal rate and regular rhythm.     Heart sounds: Normal heart sounds, S1 normal and S2 normal. No murmur heard. Pulmonary:     Effort: No respiratory distress.     Breath sounds: Normal breath sounds. No stridor. No wheezing or rales.  Lymphadenopathy:     Head:     Right side of head: No tonsillar adenopathy.     Left side of head: No tonsillar adenopathy.     Cervical: No cervical adenopathy.  Skin:    Findings: No erythema or rash.     Nails: There is no clubbing.  Neurological:     Mental Status: She is alert.     Diagnostics:    Spirometry was performed and demonstrated an FEV1 of *** at *** % of predicted.  The patient had an Asthma Control Test with the following results:  .    Assessment and Plan:   No diagnosis found.  1. Continue to Treat inflammation:   A. Budesonide 0.5 mg - nebulize 2 times per day  B. Perforomist  2 ml - nebulize 2 times per day   C. nasal fluticasone - 1 spray each nostril 2 times per day  D. montelukast 10 mg tablet 1 time per day  2. Continue to Treat reflux /  LPR:   A. omeprazole 40 mg twice a day  B. Famotidine 40 mg once a day in evening  C. no caffeine or chocolate consumption  D. Replace throat clearing with swallowing/drinking maneuver  3. If needed:   A. ProAir HFA or albuterol nebulization  B. cetirizine 10 mg tablet 1 time per day  C.  OTC Mucinex DM  D.  OTC nasal saline  4. Increase Budesonide nebulization (NOT perforomist) to 4 times a day with flare up  5.  Return to clinic in 6 months or earlier if there is a problem  6. Influenza = Tamiflu. Covid = Paxlovid  Rejeana appears to be doing pretty well.  She has the option of increasing her budesonide should she develop a flareup of her asthma but she will consistently use budesonide and Perforomist twice a day as a preventative.  She can use nasal fluticasone and montelukast for her upper airway disease which appears to be going well.  And she can continue on a proton pump inhibitor and H2 receptor blocker to address her LPR.  Assuming she does well with this plan I will see her back in this clinic in 6 months or earlier if there is a problem.  Laurette Schimke, MD Allergy / Immunology [facility]

## 2023-10-03 ENCOUNTER — Encounter: Payer: Self-pay | Admitting: Allergy and Immunology

## 2023-10-03 NOTE — Addendum Note (Signed)
Addended by: Orson Aloe on: 10/03/2023 11:27 AM   Modules accepted: Orders

## 2023-10-19 ENCOUNTER — Encounter: Payer: Self-pay | Admitting: Allergy

## 2023-10-19 ENCOUNTER — Other Ambulatory Visit: Payer: Self-pay

## 2023-10-19 ENCOUNTER — Ambulatory Visit: Admitting: Allergy

## 2023-10-19 VITALS — BP 116/82 | HR 110 | Temp 98.4°F | Resp 20

## 2023-10-19 DIAGNOSIS — J988 Other specified respiratory disorders: Secondary | ICD-10-CM | POA: Diagnosis not present

## 2023-10-19 DIAGNOSIS — J4540 Moderate persistent asthma, uncomplicated: Secondary | ICD-10-CM

## 2023-10-19 DIAGNOSIS — J3089 Other allergic rhinitis: Secondary | ICD-10-CM | POA: Diagnosis not present

## 2023-10-19 DIAGNOSIS — K219 Gastro-esophageal reflux disease without esophagitis: Secondary | ICD-10-CM | POA: Diagnosis not present

## 2023-10-19 MED ORDER — FLUTICASONE PROPIONATE 50 MCG/ACT NA SUSP: 1.0000 | Freq: Every day | NASAL | 5 refills | Status: DC | PRN

## 2023-10-19 MED ORDER — BUDESONIDE 0.5 MG/2ML IN SUSP: 0.5000 mg | Freq: Two times a day (BID) | RESPIRATORY_TRACT | 5 refills | Status: DC

## 2023-10-19 MED ORDER — OMEPRAZOLE 40 MG PO CPDR: 40.0000 mg | DELAYED_RELEASE_CAPSULE | Freq: Two times a day (BID) | ORAL | 3 refills | Status: DC

## 2023-10-19 MED ORDER — ALBUTEROL SULFATE HFA 108 (90 BASE) MCG/ACT IN AERS: 2.0000 | INHALATION_SPRAY | RESPIRATORY_TRACT | 1 refills | Status: DC | PRN

## 2023-10-19 MED ORDER — ALBUTEROL SULFATE (2.5 MG/3ML) 0.083% IN NEBU: 2.5000 mg | INHALATION_SOLUTION | RESPIRATORY_TRACT | 1 refills | Status: DC | PRN

## 2023-10-19 MED ORDER — LEVALBUTEROL HCL 1.25 MG/3ML IN NEBU
1.2500 mg | INHALATION_SOLUTION | Freq: Once | RESPIRATORY_TRACT | Status: AC
  Administered 2023-10-19: 1.25 mg via RESPIRATORY_TRACT

## 2023-10-19 MED ORDER — METHYLPREDNISOLONE 4 MG PO TBPK: ORAL_TABLET | ORAL | 0 refills | Status: AC

## 2023-10-19 MED ORDER — FAMOTIDINE 40 MG PO TABS: 40.0000 mg | ORAL_TABLET | Freq: Every evening | ORAL | 5 refills | Status: DC

## 2023-10-19 MED ORDER — CETIRIZINE HCL 10 MG PO TABS: 10.0000 mg | ORAL_TABLET | Freq: Every day | ORAL | 5 refills | Status: DC | PRN

## 2023-10-19 MED ORDER — FORMOTEROL FUMARATE 20 MCG/2ML IN NEBU: 20.0000 ug | INHALATION_SOLUTION | Freq: Two times a day (BID) | RESPIRATORY_TRACT | 5 refills | Status: DC

## 2023-10-19 MED ORDER — MONTELUKAST SODIUM 10 MG PO TABS: 10.0000 mg | ORAL_TABLET | Freq: Every day | ORAL | 5 refills | Status: DC

## 2023-10-19 NOTE — Progress Notes (Signed)
 Follow Up Note  RE: Mia Meyer MRN: [id number] DOB: 12-18-1995 Date of Office Visit: 10/19/2023  Referring provider: Judd Lien, PA-C Primary care provider: Judd Lien, PA-C  Chief Complaint: Asthma (Having a flare since 2 weeks ago. Using all of her inhalers. Tried day/night cough medicine, mucinex, teas/honey, nebulizer.)  History of Present Illness: I had the pleasure of seeing Mia Meyer for a follow up visit at the [facility] on 10/19/2023. She is a 28 y.o. female, who is being followed for asthma, allergic rhinitis, LPRD. Her previous allergy office visit was on 10/02/2023 with Dr. Lucie Leather. Today is a new complaint visit of asthma flare . She is accompanied today by her mother who provided/contributed to the history.   Discussed the use of AI scribe software for clinical note transcription with the patient, who gave verbal consent to proceed.    She has been experiencing an asthma exacerbation characterized by blocked sinuses, coughing up clear to yellowish-green mucus, and wheezing. The wheezing is inaudible to herself but noticeable to others, and she often feels like she is gagging and trying to catch her breath. The coughing is persistent, especially at night, and she uses pillows to sleep in an elevated position. These symptoms began approximately two weeks ago.   Her current medication regimen includes using a nebulizer with budesonide and perforomist twice daily, once in the morning and once at night. She also uses albuterol as needed due to persistent coughing, which provides some relief but the symptoms return. She continues to take montelukast and uses Flonase nasal spray daily, administering two squirts per nostril. Additionally, she takes omeprazole twice a day and famotidine once a day for reflux, which she reports has not been acting up recently.  She has a history of epilepsy, for which she takes Keppra. Her epilepsy has been  well-controlled, with the last episode occurring in 2021, approximately two years ago. She has not experienced any side effects from her medications, including prednisone, which she last took about a year ago.  No fevers or chills. No recent illnesses in those around her. She sometimes experiences drainage down the back of her throat.     Assessment and Plan: [name] is a 28 y.o. female with: Not well controlled moderate persistent asthma Respiratory infection Increased symptoms with coughing, wheezing, and mucus production. Current regimen includes Budesonide and Perforomist via nebulizer, and Albuterol as needed. Patient reports some relief with Albuterol, but symptoms persist, particularly at night. Difficulty using HFA inhaler w/spacer. Today's spirometry showed moderate obstructive disease with no improvement in FEV1 post bronchodilator treatment. Clinically feeling slightly improved.  Start Medrol pak. May take mucinex twice a day as needed with plenty of water to loosen up the phlegm. Daily controller medication(s):  Continue budesonide 0.5mg  nebulizer twice a day. Continue formoterol (perforomist) nebulizer twice a day. During respiratory infections/flares:  Pretreat with albuterol 2 puffs or albuterol nebulizer.  If you need to use your albuterol nebulizer machine back to back within 15-30 minutes with no relief then please go to the ER/urgent care for further evaluation.  May use albuterol rescue inhaler 2 puffs or nebulizer every 4 to 6 hours as needed for shortness of breath, chest tightness, coughing, and wheezing. May use albuterol rescue inhaler 2 puffs 5 to 15 minutes prior to strenuous physical activities. Monitor frequency of use - if you need to use it more than twice per week on a consistent basis let us know.  Briefly discussed use  of asthma biologics to control symptoms but they would like to think about this and discuss with their main allergist - Dr. Lucie Leather.  LPRD  (laryngopharyngeal reflux disease) No recent exacerbations reported. Continue famotidine 40mg  and omeprazole 40mg  BID as prescribed.  Other allergic rhinitis Continue Singulair (montelukast) 10mg  daily at night. Use over the counter antihistamines such as Zyrtec (cetirizine), Claritin (loratadine), Allegra (fexofenadine), or Xyzal (levocetirizine) daily as needed. May switch antihistamines every few months. Use Flonase (fluticasone) nasal spray 1-2 sprays per nostril once a day as needed for nasal congestion.  Nasal saline spray (i.e., Simply Saline) or nasal saline lavage (i.e., NeilMed) is recommended as needed and prior to medicated nasal sprays.  Requested medication refills to [facility] pharmacy.  Return in about 5 months (around 03/20/2024). - Already scheduled with Dr. Lucie Leather.   Meds ordered this encounter  Medications   levalbuterol (XOPENEX) nebulizer solution 1.25 mg   methylPREDNISolone (MEDROL DOSEPAK) 4 MG TBPK tablet    Sig: Take 6 tablets on day 1, 5 tablets on day 2, 4 tabs on day 3, 3 tabs on day 4, 2 tabs on day 5, 1 tab on day 6.    Dispense:  21 tablet    Refill:  0   famotidine (PEPCID) 40 MG tablet    Sig: Take 1 tablet (40 mg total) by mouth at bedtime.    Dispense:  30 tablet    Refill:  5   formoterol (PERFOROMIST) 20 MCG/2ML nebulizer solution    Sig: Take 2 mLs (20 mcg total) by nebulization 2 (two) times daily.    Dispense:  180 mL    Refill:  5   cetirizine (ZYRTEC) 10 MG tablet    Sig: Take 1 tablet (10 mg total) by mouth daily as needed for allergies or rhinitis.    Dispense:  30 tablet    Refill:  5   montelukast (SINGULAIR) 10 MG tablet    Sig: Take 1 tablet (10 mg total) by mouth at bedtime.    Dispense:  30 tablet    Refill:  5   omeprazole (PRILOSEC) 40 MG capsule    Sig: Take 1 capsule (40 mg total) by mouth 2 (two) times daily.    Dispense:  60 capsule    Refill:  3   albuterol (PROVENTIL) (2.5 MG/3ML) 0.083% nebulizer solution    Sig: Take 3  mLs (2.5 mg total) by nebulization every 4 (four) hours as needed for wheezing or shortness of breath (coughing fits).    Dispense:  150 mL    Refill:  1   fluticasone (FLONASE) 50 MCG/ACT nasal spray    Sig: Place 1-2 sprays into both nostrils daily as needed (nasal congestion).    Dispense:  16 g    Refill:  5   budesonide (PULMICORT) 0.5 MG/2ML nebulizer solution    Sig: Take 2 mLs (0.5 mg total) by nebulization in the morning and at bedtime.    Dispense:  120 mL    Refill:  5   albuterol (VENTOLIN HFA) 108 (90 Base) MCG/ACT inhaler    Sig: Inhale 2 puffs into the lungs every 4 (four) hours as needed for wheezing or shortness of breath (coughing fits).    Dispense:  18 g    Refill:  1   Lab Orders  No laboratory test(s) ordered today    Diagnostics: Spirometry:  Tracings reviewed. Her effort: Good reproducible efforts. FVC: 2.15L FEV1: 1.45L, 55% predicted FEV1/FVC ratio: 67% Interpretation: Spirometry consistent with moderate  obstructive disease with no improvement in FEV1 post bronchodilator treatment. Clinically feeling slightly improved.   Please see scanned spirometry results for details.  Results discussed with patient/family.   Medication List:  Current Outpatient Medications  Medication Sig Dispense Refill   albuterol (PROVENTIL) (2.5 MG/3ML) 0.083% nebulizer solution Take 3 mLs (2.5 mg total) by nebulization every 4 (four) hours as needed for wheezing or shortness of breath (coughing fits). 150 mL 1   albuterol (VENTOLIN HFA) 108 (90 Base) MCG/ACT inhaler Inhale 2 puffs into the lungs every 4 (four) hours as needed for wheezing or shortness of breath (coughing fits). 18 g 1   amitriptyline (ELAVIL) 25 MG tablet TAKE 1 TABLET NIGHTLY. MAY START WITH 1/2 TABLET FOR A FEW DAYS     azelastine (ASTELIN) 0.1 % nasal spray PLACE 1 TO 2 SPRAYS IN EACH NOSTRIL TWICE DAILY FOR DRAINAGE 30 mL 5   budesonide (PULMICORT) 0.5 MG/2ML nebulizer solution Take 2 mLs (0.5 mg total)  by nebulization in the morning and at bedtime. 120 mL 5   clindamycin-benzoyl peroxide (BENZACLIN) gel Apply topically.     cyclobenzaprine (FLEXERIL) 5 MG tablet Take 5 mg by mouth 2 (two) times daily. As directed.  0   diazepam (DIASTAT ACUDIAL) 10 MG GEL      Docusate Sodium 100 MG/10ML LIQD Take 1 capsule by mouth daily.     fluticasone (FLONASE) 50 MCG/ACT nasal spray Place 1-2 sprays into both nostrils daily as needed (nasal congestion). 16 g 5   ibuprofen (ADVIL,MOTRIN) 600 MG tablet Take 600 mg by mouth every 6 (six) hours as needed for pain.  0   iron polysaccharides (NIFEREX) 150 MG capsule Take 1 capsule by mouth daily.     KURVELO 0.15-30 MG-MCG tablet Take 1 tablet by mouth daily.     methylPREDNISolone (MEDROL DOSEPAK) 4 MG TBPK tablet Take 6 tablets on day 1, 5 tablets on day 2, 4 tabs on day 3, 3 tabs on day 4, 2 tabs on day 5, 1 tab on day 6. 21 tablet 0   Norethindrone Acetate-Ethinyl Estrad-FE ([name] 24 FE) 1-20 MG-MCG(24) tablet [name] 24 Fe 1 mg-20 mcg (24)/75 mg (4) tablet  TAKE 1 TABLET BY MOUTH EVERY DAY     ondansetron (ZOFRAN-ODT) 4 MG disintegrating tablet      polyethylene glycol powder (GLYCOLAX/MIRALAX) 17 GM/SCOOP powder 17g/scoop daily 255 g 0   Respiratory Therapy Supplies (FULL KIT NEBULIZER SET) MISC 1 kit by Does not apply route See admin instructions. 1 each 0   cetirizine (ZYRTEC) 10 MG tablet Take 1 tablet (10 mg total) by mouth daily as needed for allergies or rhinitis. 30 tablet 5   famotidine (PEPCID) 40 MG tablet Take 1 tablet (40 mg total) by mouth at bedtime. 30 tablet 5   formoterol (PERFOROMIST) 20 MCG/2ML nebulizer solution Take 2 mLs (20 mcg total) by nebulization 2 (two) times daily. 180 mL 5   levETIRAcetam (KEPPRA XR) 750 MG 24 hr tablet Take 750 mg by mouth every 12 (twelve) hours.     montelukast (SINGULAIR) 10 MG tablet Take 1 tablet (10 mg total) by mouth at bedtime. 30 tablet 5   omeprazole (PRILOSEC) 40 MG capsule Take 1 capsule (40 mg  total) by mouth 2 (two) times daily. 60 capsule 3   Current Facility-Administered Medications  Medication Dose Route Frequency Provider Last Rate Last Admin   0.9 %  sodium chloride infusion  500 mL Intravenous Once Sherrilyn Rist, MD  Allergies: Allergies  Allergen Reactions   Caffeine Other (See Comments)    Pt lethargic with it, [facility] brand   Molds & Smuts Swelling    Cough.    Other     Seasonal Allergies     Oxycodone Itching    Mom states it's this particular formulation. Per Mother- Only [facility] from [facility] is okay. Mom states it's this particular formulation. Per Mother- Only [facility]codone from [facility] is okay.    I reviewed her past medical history, social history, family history, and environmental history and no significant changes have been reported from her previous visit.  Review of Systems  Constitutional:  Negative for appetite change, chills, fever and unexpected weight change.  HENT:  Negative for congestion and rhinorrhea.   Eyes:  Negative for itching.  Respiratory:  Positive for cough and wheezing. Negative for chest tightness and shortness of breath.   Cardiovascular:  Negative for chest pain.  Gastrointestinal:  Negative for abdominal pain.  Genitourinary:  Negative for difficulty urinating.  Skin:  Negative for rash.  Neurological:  Negative for headaches.    Objective: BP 116/82 (BP Location: Left Arm, Patient Position: Sitting, Cuff Size: Large)   Pulse (!) 110   Temp 98.4 F (36.9 C) (Temporal)   Resp 20   SpO2 99%  There is no height or weight on file to calculate BMI. Physical Exam Vitals and nursing note reviewed.  Constitutional:      Appearance: Normal appearance. She is well-developed.  HENT:     Head: Normocephalic and atraumatic.     Right Ear: Tympanic membrane and external ear normal.     Left Ear: Tympanic membrane and external ear normal.     Nose: Nose normal.     Mouth/Throat:     Mouth: Mucous membranes are moist.      Pharynx: Oropharynx is clear.  Eyes:     Conjunctiva/sclera: Conjunctivae normal.  Cardiovascular:     Rate and Rhythm: Normal rate and regular rhythm.     Heart sounds: Normal heart sounds. No murmur heard.    No friction rub. No gallop.  Pulmonary:     Effort: Pulmonary effort is normal.     Breath sounds: Normal breath sounds. No wheezing, rhonchi or rales.  Musculoskeletal:     Cervical back: Neck supple.  Skin:    General: Skin is warm.     Findings: No rash.  Neurological:     Mental Status: She is alert and oriented to person, place, and time.  Psychiatric:        Behavior: Behavior normal.    Previous notes and tests were reviewed. The plan was reviewed with the patient/family, and all questions/concerned were addressed.  It was my pleasure to see Yazmin today and participate in her care. Please feel free to contact me with any questions or concerns.  Sincerely,  Wyline Mood, DO Allergy & Immunology  [facility] office: [phone] [address] office: [phone]

## 2023-10-19 NOTE — Patient Instructions (Addendum)
 Asthma Start Medrol pak. May take mucinex twice a day as needed with plenty of water to loosen up the phlegm. Daily controller medication(s):  Continue budesonide 0.5mg  nebulizer twice a day. Continue formoterol (perforomist) nebulizer twice a day. During respiratory infections/flares:  Pretreat with albuterol 2 puffs or albuterol nebulizer.  If you need to use your albuterol nebulizer machine back to back within 15-30 minutes with no relief then please go to the ER/urgent care for further evaluation.  May use albuterol rescue inhaler 2 puffs or nebulizer every 4 to 6 hours as needed for shortness of breath, chest tightness, coughing, and wheezing. May use albuterol rescue inhaler 2 puffs 5 to 15 minutes prior to strenuous physical activities. Monitor frequency of use - if you need to use it more than twice per week on a consistent basis let us know.  Breathing control goals:  Full participation in all desired activities (may need albuterol before activity) Albuterol use two times or less a week on average (not counting use with activity) Cough interfering with sleep two times or less a month Oral steroids no more than once a year No hospitalizations   Environmental allergies Continue Singulair (montelukast) 10mg  daily at night. Use over the counter antihistamines such as Zyrtec (cetirizine), Claritin (loratadine), Allegra (fexofenadine), or Xyzal (levocetirizine) daily as needed. May switch antihistamines every few months. Use Flonase (fluticasone) nasal spray 1-2 sprays per nostril once a day as needed for nasal congestion.  Nasal saline spray (i.e., Simply Saline) or nasal saline lavage (i.e., NeilMed) is recommended as needed and prior to medicated nasal sprays.  Reflux Continue famotidine 40mg  and omeprazole 40mg  BID as prescribed.  Follow up with Dr. Lucie Leather in August.   Drink plenty of fluids. Water, juice, clear broth or warm lemon water are good choices. Avoid caffeine and  alcohol, which can dehydrate you. Eat chicken soup. Chicken soup and other warm fluids can be soothing and loosen congestion. Rest. Adjust your room's temperature and humidity. Keep your room warm but not overheated. If the air is dry, a cool-mist humidifier or vaporizer can moisten the air and help ease congestion and coughing. Keep the humidifier clean to prevent the growth of bacteria and molds. Soothe your throat. Perform a saltwater gargle. Dissolve one-quarter to a half teaspoon of salt in a 4- to 8-ounce glass of warm water. This can relieve a sore or scratchy throat temporarily. Use saline nasal drops. To help relieve nasal congestion, try saline nasal drops. You can buy these drops over the counter, and they can help relieve symptoms ? even in children. Take over-the-counter cold and cough medications. For adults and children older than 5, over-the-counter decongestants, antihistamines and pain relievers might offer some symptom relief. However, they won't prevent a cold or shorten its duration.

## 2024-02-07 ENCOUNTER — Telehealth: Payer: Self-pay | Admitting: Allergy and Immunology

## 2024-02-07 NOTE — Telephone Encounter (Signed)
 Becky w/[employer] [phone]) needs update on Prior Auth for Medicaid coverage of budesonide  and formoterol  - already covered by [facility], but we need to directly give info to state on pre-auth first

## 2024-02-08 NOTE — Telephone Encounter (Signed)
 PT's mother called re: Prior Auth, and I advised that [facility] has notes in here on how to clear this hurdle, just need to get clinical staff to write when available. Mom thanked

## 2024-02-14 ENCOUNTER — Telehealth: Payer: Self-pay

## 2024-02-14 NOTE — Telephone Encounter (Signed)
 budesonide  and formoterol  neb solution needs a PA completed. Per Dr. Maurilio this patient can not coordinate use of metered dose inhalers or dry powder inhalers.    - once approved her Care Coordinator would like to be notified ( [phone] ).

## 2024-02-14 NOTE — Telephone Encounter (Signed)
 Message sent to PA team to work on neb prior auths. Dr. Rowan note has been added to the message

## 2024-02-18 ENCOUNTER — Telehealth: Payer: Self-pay

## 2024-02-18 ENCOUNTER — Other Ambulatory Visit (HOSPITAL_COMMUNITY): Payer: Self-pay

## 2024-02-18 NOTE — Telephone Encounter (Signed)
 Per test claims through patients [employer] Medicare plan- no PA is needed, issue is that they have a copay and are stating that they are not supposed to have one. Medications should be going through Medicare Part B but the patients current pharmacy does not do billing through Part B. Medications will have to be sent to another pharmacy to process claims through Medicare Part B.   typically a lot of smaller/local/mom and pop pharmacies are not contracted through Part B and cannot bill claims to them   [facility] is able to bill Part B if the patient is able/wants to go through a [facility]  for Medicare Part B billing the ICD-10 code is required on the script so new ones might have to be sent  *called patient pharmacy ([facility]) and they cannot bill Part B

## 2024-02-18 NOTE — Telephone Encounter (Signed)
 Per test claims through patients [employer] Medicare plan- no PA is needed, issue is that they have a copay and are stating that they are not supposed to have one. Medications should be going through Medicare Part B but the patients current pharmacy does not do billing through Part B. Medications will have to be sent to another pharmacy to process claims through Medicare Part B.   typically a lot of smaller/local/mom and pop pharmacies are not contracted through Part B and cannot bill claims to them  [facility] is able to bill Part B if the patient is able/wants to go through a [facility] for Medicare Part B billing the ICD-10 code is required on the script so new ones might have to be sent

## 2024-02-19 MED ORDER — OMEPRAZOLE 40 MG PO CPDR
40.0000 mg | DELAYED_RELEASE_CAPSULE | Freq: Two times a day (BID) | ORAL | 3 refills | Status: DC
Start: 2024-02-19 — End: 2024-04-15

## 2024-02-19 MED ORDER — MONTELUKAST SODIUM 10 MG PO TABS: 10.0000 mg | ORAL_TABLET | Freq: Every day | ORAL | 5 refills | Status: DC

## 2024-02-19 MED ORDER — ALBUTEROL SULFATE (2.5 MG/3ML) 0.083% IN NEBU: 2.5000 mg | INHALATION_SOLUTION | RESPIRATORY_TRACT | 1 refills | Status: DC | PRN

## 2024-02-19 MED ORDER — CETIRIZINE HCL 10 MG PO TABS: 10.0000 mg | ORAL_TABLET | Freq: Every day | ORAL | 5 refills | Status: DC | PRN

## 2024-02-19 MED ORDER — BUDESONIDE 0.5 MG/2ML IN SUSP: 0.5000 mg | Freq: Two times a day (BID) | RESPIRATORY_TRACT | 5 refills | Status: DC

## 2024-02-19 MED ORDER — FAMOTIDINE 40 MG PO TABS: 40.0000 mg | ORAL_TABLET | Freq: Every evening | ORAL | 5 refills | Status: DC

## 2024-02-19 MED ORDER — FORMOTEROL FUMARATE 20 MCG/2ML IN NEBU: 20.0000 ug | INHALATION_SOLUTION | Freq: Two times a day (BID) | RESPIRATORY_TRACT | 5 refills | Status: DC

## 2024-02-19 MED ORDER — FLUTICASONE PROPIONATE 50 MCG/ACT NA SUSP: 1.0000 | Freq: Every day | NASAL | 5 refills | Status: DC | PRN

## 2024-02-19 NOTE — Telephone Encounter (Signed)
 I called the patient and moved the medications back to [facility]. Patients guardian states that they are being forced to pay a copay for medications that are not correctly dosed out and they are making her pay a combined copay for the upcoming refills she has not even gotten. Patient would like to continue using [facility] only due to issues with [facility] pharmacy.

## 2024-02-19 NOTE — Addendum Note (Signed)
 Addended by: MARINDA SHERROD CROME on: 02/19/2024 12:07 PM   Modules accepted: Orders

## 2024-02-19 NOTE — Addendum Note (Signed)
 Addended by: MARINDA SHERROD CROME on: 02/19/2024 12:03 PM   Modules accepted: Orders

## 2024-03-07 ENCOUNTER — Telehealth: Payer: Self-pay | Admitting: Allergy and Immunology

## 2024-03-07 NOTE — Telephone Encounter (Signed)
 [employer] called and needed prior authorizations for albuterol  and formoteral and requested a call back at [phone]

## 2024-03-10 NOTE — Telephone Encounter (Signed)
 Per previous issues with nebulizer solutions:  Per test claims through patients [employer] Medicare plan- no PA is needed, issue is that they have a copay and are stating that they are not supposed to have one. Medications should be going through Medicare Part B but the patients current pharmacy does not do billing through Part B. Medications will have to be sent to another pharmacy to process claims through Medicare Part B.    typically a lot of smaller/local/mom and pop pharmacies are not contracted through Part B and cannot bill claims to them    [facility] is able to bill Part B if the patient is able/wants to go through a [facility]   for Medicare Part B billing the ICD-10 code is required on the script so new ones might have to be sent   *called patient pharmacy ([facility]) and they cannot bill Part B

## 2024-03-11 MED ORDER — ALBUTEROL SULFATE (2.5 MG/3ML) 0.083% IN NEBU: 2.5000 mg | INHALATION_SOLUTION | RESPIRATORY_TRACT | 1 refills | Status: AC | PRN

## 2024-03-11 NOTE — Telephone Encounter (Signed)
 Called and spoke to patients mother and informed her of the note per the PA team. Mom still questioned the copay. I informed mom that she needed to contact her insurance company and get the information about her copay's and out of pocket deductibles. The albuterol  has been sent into the pharmacy once again at [facility].

## 2024-03-11 NOTE — Addendum Note (Signed)
 Addended by: FRANCIS ROULEAU A on: 03/11/2024 07:21 PM   Modules accepted: Orders

## 2024-04-01 ENCOUNTER — Ambulatory Visit: Payer: Medicare HMO | Admitting: Allergy and Immunology

## 2024-04-15 ENCOUNTER — Other Ambulatory Visit: Payer: Self-pay

## 2024-04-15 ENCOUNTER — Ambulatory Visit (INDEPENDENT_AMBULATORY_CARE_PROVIDER_SITE_OTHER): Admitting: Allergy and Immunology

## 2024-04-15 ENCOUNTER — Encounter: Payer: Self-pay | Admitting: Allergy and Immunology

## 2024-04-15 VITALS — BP 110/72 | HR 101 | Temp 97.6°F | Resp 20 | Ht 61.75 in | Wt 249.1 lb

## 2024-04-15 DIAGNOSIS — J4540 Moderate persistent asthma, uncomplicated: Secondary | ICD-10-CM

## 2024-04-15 DIAGNOSIS — K219 Gastro-esophageal reflux disease without esophagitis: Secondary | ICD-10-CM | POA: Diagnosis not present

## 2024-04-15 DIAGNOSIS — J3089 Other allergic rhinitis: Secondary | ICD-10-CM | POA: Diagnosis not present

## 2024-04-15 MED ORDER — OMEPRAZOLE 40 MG PO CPDR: 40.0000 mg | DELAYED_RELEASE_CAPSULE | Freq: Two times a day (BID) | ORAL | 1 refills | Status: DC

## 2024-04-15 MED ORDER — CETIRIZINE HCL 10 MG PO TABS
10.0000 mg | ORAL_TABLET | Freq: Every day | ORAL | 1 refills | Status: AC | PRN
Start: 2024-04-15 — End: ?

## 2024-04-15 MED ORDER — FORMOTEROL FUMARATE 20 MCG/2ML IN NEBU: 20.0000 ug | INHALATION_SOLUTION | Freq: Two times a day (BID) | RESPIRATORY_TRACT | 1 refills | Status: AC

## 2024-04-15 MED ORDER — BUDESONIDE 0.5 MG/2ML IN SUSP: 0.5000 mg | Freq: Two times a day (BID) | RESPIRATORY_TRACT | 1 refills | Status: AC

## 2024-04-15 MED ORDER — MONTELUKAST SODIUM 10 MG PO TABS: 10.0000 mg | ORAL_TABLET | Freq: Every evening | ORAL | 1 refills | Status: AC

## 2024-04-15 MED ORDER — FAMOTIDINE 40 MG PO TABS: 40.0000 mg | ORAL_TABLET | Freq: Every evening | ORAL | 1 refills | Status: AC

## 2024-04-15 MED ORDER — FLUTICASONE PROPIONATE 50 MCG/ACT NA SUSP: 1.0000 | Freq: Every day | NASAL | 1 refills | Status: AC | PRN

## 2024-04-15 MED ORDER — FULL KIT NEBULIZER SET MISC: 1.0000 | 1 refills | Status: AC

## 2024-04-15 NOTE — Progress Notes (Unsigned)
 [place] - [place]   Follow-up Note  Referring Provider: Douglass Gerard VEAR DEVONNA Primary Provider: Douglass Gerard VEAR DEVONNA Date of Office Visit: 04/15/2024  Subjective:   Mia Meyer (DOB: 1995-11-12) is a 28 y.o. female who [facility] in re-evaluation of the following:  HPI: [name] returns to this clinic in evaluation of asthma, allergic rhinitis, reflux induced respiratory disease.  I last saw her in this clinic 02 October 2023.  She visited with Dr. Luke on 19 October 2023 for a viral induced upper respiratory tract infection with asthma flare requiring steroid administration.  Since her last visit she has done very well with her asthma while continuing to use nebulized formoterol  and budesonide  twice a day on a consistent basis.  While utilizing this combination of therapy she has no need to use any albuterol  and she can exert herself without much problem.  And she has had very little problems with her upper airway while utilizing the nasal steroid and montelukast .    She has not required a systemic steroid or an antibiotic for any type of airway issue.  She believes that her throat is doing very well as is her reflux with using omeprazole  and famotidine  at relatively high dose.  On occasion she feels as though she is getting some mucus in her throat but overall continues to do very well with this plan.  She tells me that she has been having problems with gallstones.  About every month she has an attack where she has lots of pain and nausea and she has seen [facility] about having her gallbladder removed but there seems to be a logistical block about having that procedure performed.  Allergies as of 04/15/2024       Reactions   Caffeine Other (See Comments)   Pt lethargic with it, [facility] brand   Molds & Smuts Swelling   Cough.    Other    Seasonal Allergies    Oxycodone Itching   Mom  states it's this particular formulation. Per Mother- Only Oxycodone from [facility] is okay. Mom states it's this particular formulation. Per Mother- Only Oxycodone from [facility] is okay.        Medication List    albuterol  (2.5 MG/3ML) 0.083% nebulizer solution Commonly known as: PROVENTIL  Take 3 mLs (2.5 mg total) by nebulization every 4 (four) hours as needed for wheezing or shortness of breath (coughing fits).   amitriptyline 25 MG tablet Commonly known as: ELAVIL TAKE 1 TABLET NIGHTLY. MAY START WITH 1/2 TABLET FOR A FEW DAYS   azelastine  0.1 % nasal spray Commonly known as: ASTELIN  PLACE 1 TO 2 SPRAYS IN EACH NOSTRIL TWICE DAILY FOR DRAINAGE   budesonide  0.5 MG/2ML nebulizer solution Commonly known as: Pulmicort  Take 2 mLs (0.5 mg total) by nebulization in the morning and at bedtime.   cetirizine  10 MG tablet Commonly known as: ZYRTEC  Take 1 tablet (10 mg total) by mouth daily as needed for allergies or rhinitis.   clindamycin-benzoyl peroxide gel Commonly known as: BENZACLIN Apply topically.   cyclobenzaprine 5 MG tablet Commonly known as: FLEXERIL Take 5 mg by mouth 2 (two) times daily. As directed.   diazepam 10 MG Gel Commonly known as: DIASTAT ACUDIAL   Docusate Sodium 100 MG/10ML Liqd Take 1 capsule by mouth daily.   famotidine  40 MG tablet Commonly known as: Pepcid  Take 1 tablet (40 mg total) by mouth at bedtime.  fluticasone  50 MCG/ACT nasal spray Commonly known as: FLONASE  Place 1-2 sprays into both nostrils daily as needed (nasal congestion).   formoterol  20 MCG/2ML nebulizer solution Commonly known as: Perforomist  Take 2 mLs (20 mcg total) by nebulization 2 (two) times daily.   Full Kit Nebulizer Set Misc 1 kit by Does not apply route See admin instructions.   [name] 24 Fe 1-20 MG-MCG(24) tablet Generic drug: Norethindrone Acetate-Ethinyl Estrad-FE [name] 24 Fe 1 mg-20 mcg (24)/75 mg (4) tablet  TAKE 1 TABLET BY MOUTH EVERY DAY   ibuprofen 600 MG  tablet Commonly known as: ADVIL Take 600 mg by mouth every 6 (six) hours as needed for pain.   iron polysaccharides 150 MG capsule Commonly known as: NIFEREX Take 1 capsule by mouth daily.   Kurvelo 0.15-30 MG-MCG tablet Generic drug: levonorgestrel-ethinyl estradiol Take 1 tablet by mouth daily.   levETIRAcetam 750 MG 24 hr tablet Commonly known as: KEPPRA XR Take 750 mg by mouth every 12 (twelve) hours.   methylPREDNISolone  4 MG Tbpk tablet Commonly known as: MEDROL  DOSEPAK Take 6 tablets on day 1, 5 tablets on day 2, 4 tabs on day 3, 3 tabs on day 4, 2 tabs on day 5, 1 tab on day 6.   montelukast  10 MG tablet Commonly known as: SINGULAIR  Take 1 tablet (10 mg total) by mouth at bedtime.   omeprazole  40 MG capsule Commonly known as: PRILOSEC Take 1 capsule (40 mg total) by mouth 2 (two) times daily.   ondansetron 4 MG disintegrating tablet Commonly known as: ZOFRAN-ODT   polyethylene glycol powder 17 GM/SCOOP powder Commonly known as: GLYCOLAX /MIRALAX  17g/scoop daily    Past Medical History:  Diagnosis Date   Allergy    mold allergy   Asthma    Cerebral palsy (HCC)    Common migraine 11/27/2017   GERD (gastroesophageal reflux disease)    Neonatal stroke [facility])     Past Surgical History:  Procedure Laterality Date   SHUNT REPLACEMENT Left    Shunt replaced in 2002   TENDON RELEASE Right     Review of systems negative except as noted in HPI / PMHx or noted below:  Review of Systems  Constitutional: Negative.   HENT: Negative.    Eyes: Negative.   Respiratory: Negative.    Cardiovascular: Negative.   Gastrointestinal: Negative.   Genitourinary: Negative.   Musculoskeletal: Negative.   Skin: Negative.   Neurological: Negative.   Endo/Heme/Allergies: Negative.   Psychiatric/Behavioral: Negative.       Objective:   Vitals:   04/15/24 1332  BP: 110/72  Pulse: (!) 101  Resp: 20  Temp: 97.6 F (36.4 C)  SpO2: 98%   Height: 5' 1.75 (156.8 cm)   Weight: 249 lb 1.6 oz (113 kg)   Physical Exam Constitutional:      Appearance: She is not diaphoretic.  HENT:     Head: Normocephalic.     Right Ear: Tympanic membrane, ear canal and external ear normal.     Left Ear: Tympanic membrane, ear canal and external ear normal.     Nose: Nose normal. No mucosal edema or rhinorrhea.     Mouth/Throat:     Pharynx: Uvula midline. No oropharyngeal exudate.     Comments: Right tonsil stone Eyes:     Conjunctiva/sclera: Conjunctivae normal.  Neck:     Thyroid: No thyromegaly.     Trachea: Trachea normal. No tracheal tenderness or tracheal deviation.  Cardiovascular:     Rate and Rhythm: Normal rate and  regular rhythm.     Heart sounds: Normal heart sounds, S1 normal and S2 normal. No murmur heard. Pulmonary:     Effort: No respiratory distress.     Breath sounds: Normal breath sounds. No stridor. No wheezing or rales.  Lymphadenopathy:     Head:     Right side of head: No tonsillar adenopathy.     Left side of head: No tonsillar adenopathy.     Cervical: No cervical adenopathy.  Skin:    Findings: No erythema or rash.     Nails: There is no clubbing.  Neurological:     Mental Status: She is alert.     Diagnostics: Spirometry was performed and demonstrated an FEV1 of 2.15 at 82 % of predicted.  Assessment and Plan:   1. Asthma, moderate persistent, well-controlled   2. Perennial allergic rhinitis   3. LPRD (laryngopharyngeal reflux disease)    1. Continue to Treat inflammation:   A. Budesonide  0.5 mg - nebulize 2 times per day  B. Perforomist   2 ml - nebulize 2 times per day   C. nasal fluticasone  - 1 spray each nostril 2 times per day  D. montelukast  10 mg tablet 1 time per day  2. Continue to Treat reflux / LPR:   A. omeprazole  40 mg twice a day  B. Famotidine  40 mg once a day in evening  C. no caffeine or chocolate consumption  D. Replace throat clearing with swallowing/drinking maneuver  3. If needed:   A. ProAir   HFA or albuterol  nebulization  B. cetirizine  10 mg tablet 1 time per day  C.  OTC Mucinex DM  D.  OTC nasal saline  4. Increase Budesonide  nebulization (NOT perforomist ) to 4 times a day with flare up  5. Return to clinic in 6 months or earlier if there is a problem  6. Influenza = Tamiflu. Covid = Paxlovid  Leighton appears to be doing very well on her current plan of utilizing anti-inflammatory agents for both her upper and lower airway and also aggressively treating her LPR.  We are not going to change much of her therapy during today's visit.  She will continue on the plan noted above and if she does well we will see her back in this clinic in 6 months or earlier if there is a problem.  She appears to have gallstones and I have encouraged her to contact the [facility] general surgeon if she continues to have problems with her gallstones so that her gallbladder can be removed.  Camellia Denis, MD Allergy / Immunology [facility]

## 2024-04-15 NOTE — Patient Instructions (Addendum)
  1. Continue to Treat inflammation:   A. Budesonide 0.5 mg - nebulize 2 times per day  B. Perforomist  2 ml - nebulize 2 times per day   C. nasal fluticasone - 1 spray each nostril 2 times per day  D. montelukast 10 mg tablet 1 time per day  2. Continue to Treat reflux / LPR:   A. omeprazole 40 mg twice a day  B. Famotidine 40 mg once a day in evening  C. no caffeine or chocolate consumption  D. Replace throat clearing with swallowing/drinking maneuver  3. If needed:   A. ProAir HFA or albuterol nebulization  B. cetirizine 10 mg tablet 1 time per day  C.  OTC Mucinex DM  D.  OTC nasal saline  4. Increase Budesonide nebulization (NOT perforomist) to 4 times a day with flare up  5.  Return to clinic in 6 months or earlier if there is a problem  6. Influenza = Tamiflu. Covid = Paxlovid

## 2024-04-16 ENCOUNTER — Encounter: Payer: Self-pay | Admitting: Allergy and Immunology

## 2024-07-11 ENCOUNTER — Other Ambulatory Visit: Payer: Self-pay | Admitting: Family Medicine

## 2024-07-11 ENCOUNTER — Other Ambulatory Visit: Payer: Self-pay | Admitting: Allergy and Immunology

## 2024-07-13 ENCOUNTER — Other Ambulatory Visit: Payer: Self-pay | Admitting: Allergy

## 2024-07-14 ENCOUNTER — Telehealth: Payer: Self-pay

## 2024-07-14 NOTE — Telephone Encounter (Signed)
 Reject please. Thank you

## 2024-07-15 NOTE — Telephone Encounter (Signed)
 Okay rejected.

## 2024-10-14 ENCOUNTER — Ambulatory Visit: Admitting: Family
